# Patient Record
Sex: Male | Born: 1950 | Race: White | Hispanic: No | Marital: Married | Smoking: Never smoker
Health system: Southern US, Community
[De-identification: ages and names within clinical notes are randomized; demographics above are authoritative.]

## PROBLEM LIST (undated history)

## (undated) DIAGNOSIS — Z87442 Personal history of urinary calculi: Secondary | ICD-10-CM

## (undated) DIAGNOSIS — F329 Major depressive disorder, single episode, unspecified: Secondary | ICD-10-CM

## (undated) DIAGNOSIS — M5416 Radiculopathy, lumbar region: Secondary | ICD-10-CM

## (undated) DIAGNOSIS — K219 Gastro-esophageal reflux disease without esophagitis: Secondary | ICD-10-CM

## (undated) DIAGNOSIS — I10 Essential (primary) hypertension: Secondary | ICD-10-CM

## (undated) DIAGNOSIS — Z8489 Family history of other specified conditions: Secondary | ICD-10-CM

## (undated) DIAGNOSIS — M5412 Radiculopathy, cervical region: Secondary | ICD-10-CM

## (undated) DIAGNOSIS — F419 Anxiety disorder, unspecified: Secondary | ICD-10-CM

## (undated) DIAGNOSIS — C61 Malignant neoplasm of prostate: Secondary | ICD-10-CM

## (undated) DIAGNOSIS — F32A Depression, unspecified: Secondary | ICD-10-CM

## (undated) HISTORY — PX: TONSILLECTOMY: SUR1361

---

## 2000-02-11 ENCOUNTER — Encounter: Payer: Self-pay | Admitting: Internal Medicine

## 2000-02-11 ENCOUNTER — Encounter: Admission: RE | Admit: 2000-02-11 | Discharge: 2000-02-11 | Payer: Self-pay | Admitting: Internal Medicine

## 2000-04-29 ENCOUNTER — Encounter: Admission: RE | Admit: 2000-04-29 | Discharge: 2000-04-29 | Payer: Self-pay | Admitting: Internal Medicine

## 2000-04-29 ENCOUNTER — Encounter: Payer: Self-pay | Admitting: Internal Medicine

## 2000-08-01 ENCOUNTER — Encounter: Admission: RE | Admit: 2000-08-01 | Discharge: 2000-08-01 | Payer: Self-pay | Admitting: Urology

## 2000-08-01 ENCOUNTER — Encounter: Payer: Self-pay | Admitting: Urology

## 2001-11-23 ENCOUNTER — Encounter: Admission: RE | Admit: 2001-11-23 | Discharge: 2001-11-23 | Payer: Self-pay | Admitting: Urology

## 2001-11-23 ENCOUNTER — Encounter: Payer: Self-pay | Admitting: Urology

## 2002-04-20 ENCOUNTER — Encounter: Admission: RE | Admit: 2002-04-20 | Discharge: 2002-04-20 | Payer: Self-pay | Admitting: Urology

## 2002-04-20 ENCOUNTER — Encounter: Payer: Self-pay | Admitting: Urology

## 2003-11-07 ENCOUNTER — Ambulatory Visit (HOSPITAL_COMMUNITY): Admission: RE | Admit: 2003-11-07 | Discharge: 2003-11-07 | Payer: Self-pay | Admitting: Gastroenterology

## 2004-04-05 HISTORY — PX: OTHER SURGICAL HISTORY: SHX169

## 2004-06-07 ENCOUNTER — Emergency Department (HOSPITAL_COMMUNITY): Admission: EM | Admit: 2004-06-07 | Discharge: 2004-06-07 | Payer: Self-pay | Admitting: Emergency Medicine

## 2004-06-08 ENCOUNTER — Ambulatory Visit (HOSPITAL_BASED_OUTPATIENT_CLINIC_OR_DEPARTMENT_OTHER): Admission: RE | Admit: 2004-06-08 | Discharge: 2004-06-08 | Payer: Self-pay | Admitting: Urology

## 2004-06-20 ENCOUNTER — Ambulatory Visit (HOSPITAL_COMMUNITY): Admission: RE | Admit: 2004-06-20 | Discharge: 2004-06-20 | Payer: Self-pay | Admitting: Internal Medicine

## 2004-08-07 ENCOUNTER — Encounter: Admission: RE | Admit: 2004-08-07 | Discharge: 2004-08-07 | Payer: Self-pay | Admitting: Internal Medicine

## 2004-12-28 ENCOUNTER — Ambulatory Visit (HOSPITAL_COMMUNITY): Admission: RE | Admit: 2004-12-28 | Discharge: 2004-12-28 | Payer: Self-pay | Admitting: Urology

## 2006-04-05 HISTORY — PX: LITHOTRIPSY: SUR834

## 2010-04-26 ENCOUNTER — Encounter: Payer: Self-pay | Admitting: Internal Medicine

## 2012-08-15 ENCOUNTER — Other Ambulatory Visit: Payer: Self-pay | Admitting: Physician Assistant

## 2014-05-07 DIAGNOSIS — C61 Malignant neoplasm of prostate: Secondary | ICD-10-CM

## 2014-05-07 HISTORY — DX: Malignant neoplasm of prostate: C61

## 2014-05-07 HISTORY — PX: PROSTATE BIOPSY: SHX241

## 2014-06-03 ENCOUNTER — Encounter: Payer: Self-pay | Admitting: Radiation Oncology

## 2014-06-03 NOTE — Progress Notes (Signed)
GU Location of Tumor / Histology: Prostate   If Prostate Cancer, Gleason Score is (3 + 4) and PSA is (8.02)  Volume 49.3 cc     Past/Anticipated interventions by urology, if any: Dr. Raynelle Bring - Biopsy of the Prostate  Past/Anticipated interventions by medical oncology, if any: None  Weight changes, if any: no  Bowel/Bladder complaints, if any:  IPSS 0, nocturia x 1  Nausea/Vomiting, if any: no Pain issues, if any:  no  SAFETY ISSUES:  Prior radiation? NO  Pacemaker/ICD? No  Possible current pregnancy? N/A  Is the patient on methotrexate? 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

## 2014-06-04 ENCOUNTER — Ambulatory Visit
Admission: RE | Admit: 2014-06-04 | Discharge: 2014-06-04 | Disposition: A | Discharge status: Home / Self Care | Payer: BC Managed Care – PPO | Source: Ambulatory Visit | Attending: Radiation Oncology | Admitting: Radiation Oncology

## 2014-06-04 ENCOUNTER — Encounter: Payer: Self-pay | Admitting: Radiation Oncology

## 2014-06-04 VITALS — BP 145/78 | HR 79 | Temp 98.2°F | Resp 20 | Ht 67.5 in | Wt 168.0 lb

## 2014-06-04 DIAGNOSIS — C61 Malignant neoplasm of prostate: Secondary | ICD-10-CM | POA: Diagnosis not present

## 2014-06-04 DIAGNOSIS — I10 Essential (primary) hypertension: Secondary | ICD-10-CM | POA: Insufficient documentation

## 2014-06-04 DIAGNOSIS — Z7982 Long term (current) use of aspirin: Secondary | ICD-10-CM | POA: Insufficient documentation

## 2014-06-04 HISTORY — DX: Depression, unspecified: F32.A

## 2014-06-04 HISTORY — DX: Gastro-esophageal reflux disease without esophagitis: K21.9

## 2014-06-04 HISTORY — DX: Radiculopathy, lumbar region: M54.16

## 2014-06-04 HISTORY — DX: Malignant neoplasm of prostate: C61

## 2014-06-04 HISTORY — DX: Radiculopathy, cervical region: M54.12

## 2014-06-04 HISTORY — DX: Major depressive disorder, single episode, unspecified: F32.9

## 2014-06-04 HISTORY — DX: Essential (primary) hypertension: I10

## 2014-06-04 NOTE — Progress Notes (Signed)
Please see the Nurse Progress Note in the MD Initial Consult Encounter for this patient. 

## 2014-06-04 NOTE — Progress Notes (Signed)
[facility] NEW PATIENT EVALUATION  Name: Dan Medina MRN: [id number]  Date:   06/04/2014           DOB: 1950/04/29  Status: outpatient   CC: Dr. 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 male who is seen today through the courtesy of Dr. 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  He changed primary care physicians to Dr. Inda Merlin last year and began prostate cancer screening.  His PSA in December 2015 was elevated at 9.73.  He was seen by Dr. Alinda Money who repeated his PSA on 04/24/2014 and it was still elevated at 8.02 after a course of antibiotics.  He underwent ultrasound-guided biopsies on 05/07/2014.  He was found have Gleason 7 (3+4) involving 90% of one core from the right apex 2 with a pattern 4 equals 5%.  He also had Gleason 6 (3+3) involving 40% of one core from the right mid gland, 90% of a second core from the right apex, 5% of one core from the left lateral base, 50% of one core from the left lateral mid gland and 2 cores from the left apex.  Prostate volume was 49.3 mL.  He is doing well from a GU and GI standpoint.  His I PSS score is 1 and his SHIM score is 24.  PREVIOUS RADIATION THERAPY: No   PAST MEDICAL HISTORY:  has a past medical history of Cervical radiculopathy, chronic; Lumbar radiculopathy; Depression; Hypertension; Kidney stones; Esophageal reflux; and Prostate cancer (05/07/14).     PAST SURGICAL HISTORY:  Past Surgical History  Procedure Laterality Date  . Cystoscopy with ureteroscopy  2006  . Lithotripsy  2008  . Tonsillectomy    . Prostate biopsy  05/07/14    Gleason 7, volume 49.3 cc     FAMILY HISTORY: family history includes Cancer in his mother.  His mother died from lung  cancer at age 48, in his father died from old age at 32.  No family history of prostate cancer.   SOCIAL HISTORY:  reports that he has never smoked. He does not have any smokeless tobacco history on file. He reports that he drinks alcohol. He reports that he does not use illicit drugs.  Married, 2 children.  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 aspirin 81 MG tablet Take 81 mg by mouth daily.    Marland Kitchen atorvastatin (LIPITOR) 40 MG tablet Take 40 mg by mouth daily.    . Cholecalciferol (VITAMIN D-3) 1000 UNITS CAPS Take by mouth.    . Fish Oil OIL 1 tablet by Does not apply route daily.    . Multiple Vitamin (MULTI-DAY) TABS Take by mouth.    . valsartan-hydrochlorothiazide (DIOVAN-HCT) 160-12.5 MG per tablet Take 1 tablet by mouth daily.     No current facility-administered medications for this encounter.     REVIEW OF SYSTEMS:  Pertinent items are noted in HPI.    PHYSICAL EXAM:  height is 5' 7.5" (1.715 m) and weight is 168 lb (76.204 kg). His oral temperature is 98.2 F (36.8 C). His blood pressure is 145/78 and his pulse is 79. His respiration is  20.   Alert and oriented.  Rectal examination not performed today.   LABORATORY DATA:  No results found for: WBC, HGB, HCT, MCV, PLT No results found for: NA, K, CL, CO2 No results found for: ALT, AST, GGT, ALKPHOS, BILITOT   PSA 8.02 from 04/24/2014  IMPRESSION: Stage TIc intermediate risk adenocarcinoma prostate.  I explained to the patient that his prognosis is related to his stage, Gleason score, and PSA level.  His stage and PSA level are favorable while his Gleason score of 7 is of intermediate favorability.  Other risk factors include disease volume and PSA doubling time.  He does have high disease volume, and his PSA doubling time is unknown.  We discussed surgery versus active surveillance versus radiation  therapy.  I do not recommend active surveillance in view of his young age and Gleason 7.  Radiation therapy options include 5 weeks of external beam followed by seed implantation or 8 weeks of external beam/IMRT.  We also discussed the option of short-term androgen deprivation therapy which is being looked at in a national trial.  We discussed the potential acute and late toxicities of radiation therapy.  In view of his relatively young age, I favor surgery over radiation therapy.  He also understands that they may be a small chance that he would need postoperative radiation therapy adjuvantly, or for salvage in the future.   PLAN: As discussed above.   I spent 55 minutes minutes face to face with the patient and more than 50% of that time was spent in counseling and/or coordination of care.

## 2014-06-19 ENCOUNTER — Other Ambulatory Visit: Payer: Self-pay | Admitting: Urology

## 2014-07-31 NOTE — Patient Instructions (Signed)
Dan Medina  07/31/2014   Your procedure is scheduled on: 08/05/2014    Report to [facility] Main  Entrance and follow signs to               [facility] at     [place] AM.  Call this number if you have problems the morning of surgery [phone]   Remember:    Magnesium Citrate- 8-10 ounces by 12noon day before surgery.  Fleets enema nite before surgery.    Do not eat food or drink liquids :After Midnight.     Take these medicines the morning of surgery with A SIP OF WATER: none                                You may not have any metal on your body including hair pins and              piercings  Do not wear jewelry, , lotions, powders or perfumes., deodorant.               Do not wear nail polish.  Do not shave  48 hours prior to surgery.                 Do not bring valuables to the hospital. Spray.  Contacts, dentures or bridgework may not be worn into surgery.  Leave suitcase in the car. After surgery it may be brought to your room.       Special Instructions:coughing and deep breathing exercises, leg exercises               Please read over the following fact sheets you were given: _____________________________________________________________________             [facility] - Preparing for Surgery Before surgery, you can play an important role.  Because skin is not sterile, your skin needs to be as free of germs as possible.  You can reduce the number of germs on your skin by washing with CHG (chlorahexidine gluconate) soap before surgery.  CHG is an antiseptic cleaner which kills germs and bonds with the skin to continue killing germs even after washing. Please DO NOT use if you have an allergy to CHG or antibacterial soaps.  If your skin becomes reddened/irritated stop using the CHG and inform your nurse when you arrive at Short Stay. Do not shave (including legs and underarms) for at least  48 hours prior to the first CHG shower.  You may shave your face/neck. Please follow these instructions carefully:  1.  Shower with CHG Soap the night before surgery and the  morning of Surgery.  2.  If you choose to wash your hair, wash your hair first as usual with your  normal  shampoo.  3.  After you shampoo, rinse your hair and body thoroughly to remove the  shampoo.                           4.  Use CHG as you would any other liquid soap.  You can apply chg directly  to the skin and wash  Gently with a scrungie or clean washcloth.  5.  Apply the CHG Soap to your body ONLY FROM THE NECK DOWN.   Do not use on face/ open                           Wound or open sores. Avoid contact with eyes, ears mouth and genitals (private parts).                       Wash face,  Genitals (private parts) with your normal soap.             6.  Wash thoroughly, paying special attention to the area where your surgery  will be performed.  7.  Thoroughly rinse your body with warm water from the neck down.  8.  DO NOT shower/wash with your normal soap after using and rinsing off  the CHG Soap.                9.  Pat yourself dry with a clean towel.            10.  Wear clean pajamas.            11.  Place clean sheets on your bed the night of your first shower and do not  sleep with pets. Day of Surgery : Do not apply any lotions/deodorants the morning of surgery.  Please wear clean clothes to the hospital/surgery center.  FAILURE TO FOLLOW THESE INSTRUCTIONS MAY RESULT IN THE CANCELLATION OF YOUR SURGERY PATIENT SIGNATURE_________________________________  NURSE SIGNATURE__________________________________  ________________________________________________________________________  WHAT IS A BLOOD TRANSFUSION? Blood Transfusion Information  A transfusion is the replacement of blood or some of its parts. Blood is made up of multiple cells which provide different functions.  Red blood  cells carry oxygen and are used for blood loss replacement.  White blood cells fight against infection.  Platelets control bleeding.  Plasma helps clot blood.  Other blood products are available for specialized needs, such as hemophilia or other clotting disorders. BEFORE THE TRANSFUSION  Who gives blood for transfusions?   Healthy volunteers who are fully evaluated to make sure their blood is safe. This is blood bank blood. Transfusion therapy is the safest it has ever been in the practice of medicine. Before blood is taken from a donor, a complete history is taken to make sure that person has no history of diseases nor engages in risky social behavior (examples are intravenous drug use or sexual activity with multiple partners). The donor's travel history is screened to minimize risk of transmitting infections, such as malaria. The donated blood is tested for signs of infectious diseases, such as HIV and hepatitis. The blood is then tested to be sure it is compatible with you in order to minimize the chance of a transfusion reaction. If you or a relative donates blood, this is often done in anticipation of surgery and is not appropriate for emergency situations. It takes many days to process the donated blood. RISKS AND COMPLICATIONS Although transfusion therapy is very safe and saves many lives, the main dangers of transfusion include:  1. Getting an infectious disease. 2. Developing a transfusion reaction. This is an allergic reaction to something in the blood you were given. Every precaution is taken to prevent this. The decision to have a blood transfusion has been considered carefully by your caregiver before blood is given. Blood is not given unless the benefits outweigh  the risks. AFTER THE TRANSFUSION  Right after receiving a blood transfusion, you will usually feel much better and more energetic. This is especially true if your red blood cells have gotten low (anemic). The transfusion  raises the level of the red blood cells which carry oxygen, and this usually causes an energy increase.  The nurse administering the transfusion will monitor you carefully for complications. HOME CARE INSTRUCTIONS  No special instructions are needed after a transfusion. You may find your energy is better. Speak with your caregiver about any limitations on activity for underlying diseases you may have. SEEK MEDICAL CARE IF:   Your condition is not improving after your transfusion.  You develop redness or irritation at the intravenous (IV) site. SEEK IMMEDIATE MEDICAL CARE IF:  Any of the following symptoms occur over the next 12 hours:  Shaking chills.  You have a temperature by mouth above 102 F (38.9 C), not controlled by medicine.  Chest, back, or muscle pain.  People around you feel you are not acting correctly or are confused.  Shortness of breath or difficulty breathing.  Dizziness and fainting.  You get a rash or develop hives.  You have a decrease in urine output.  Your urine turns a dark color or changes to pink, red, or brown. Any of the following symptoms occur over the next 10 days:  You have a temperature by mouth above 102 F (38.9 C), not controlled by medicine.  Shortness of breath.  Weakness after normal activity.  The white part of the eye turns yellow (jaundice).  You have a decrease in the amount of urine or are urinating less often.  Your urine turns a dark color or changes to pink, red, or brown. Document Released: 03/19/2000 Document Revised: 06/14/2011 Document Reviewed: 11/06/2007 ExitCare Patient Information 2014 [place].  _______________________________________________________________________  Incentive Spirometer  An incentive spirometer is a tool that can help keep your lungs clear and active. This tool measures how well you are filling your lungs with each breath. Taking long deep breaths may help reverse or decrease the chance  of developing breathing (pulmonary) problems (especially infection) following:  A long period of time when you are unable to move or be active. BEFORE THE PROCEDURE   If the spirometer includes an indicator to show your best effort, your nurse or respiratory therapist will set it to a desired goal.  If possible, sit up straight or lean slightly forward. Try not to slouch.  Hold the incentive spirometer in an upright position. INSTRUCTIONS FOR USE  3. Sit on the edge of your bed if possible, or sit up as far as you can in bed or on a chair. 4. Hold the incentive spirometer in an upright position. 5. Breathe out normally. 6. Place the mouthpiece in your mouth and seal your lips tightly around it. 7. Breathe in slowly and as deeply as possible, raising the piston or the ball toward the top of the column. 8. Hold your breath for 3-5 seconds or for as long as possible. Allow the piston or ball to fall to the bottom of the column. 9. Remove the mouthpiece from your mouth and breathe out normally. 10. Rest for a few seconds and repeat Steps 1 through 7 at least 10 times every 1-2 hours when you are awake. Take your time and take a few normal breaths between deep breaths. 11. The spirometer may include an indicator to show your best effort. Use the indicator as a goal to work  toward during each repetition. 12. After each set of 10 deep breaths, practice coughing to be sure your lungs are clear. If you have an incision (the cut made at the time of surgery), support your incision when coughing by placing a pillow or rolled up towels firmly against it. Once you are able to get out of bed, walk around indoors and cough well. You may stop using the incentive spirometer when instructed by your caregiver.  RISKS AND COMPLICATIONS  Take your time so you do not get dizzy or light-headed.  If you are in pain, you may need to take or ask for pain medication before doing incentive spirometry. It is harder to  take a deep breath if you are having pain. AFTER USE  Rest and breathe slowly and easily.  It can be helpful to keep track of a log of your progress. Your caregiver can provide you with a simple table to help with this. If you are using the spirometer at home, follow these instructions: [place] IF:   You are having difficultly using the spirometer.  You have trouble using the spirometer as often as instructed.  Your pain medication is not giving enough relief while using the spirometer.  You develop fever of 100.5 F (38.1 C) or higher. SEEK IMMEDIATE MEDICAL CARE IF:   You cough up bloody sputum that had not been present before.  You develop fever of 102 F (38.9 C) or greater.  You develop worsening pain at or near the incision site. MAKE SURE YOU:   Understand these instructions.  Will watch your condition.  Will get help right away if you are not doing well or get worse. Document Released: 08/02/2006 Document Revised: 06/14/2011 Document Reviewed: 10/03/2006 [facility] Patient Information 2014 [place], [place].   ________________________________________________________________________

## 2014-08-01 ENCOUNTER — Encounter (HOSPITAL_COMMUNITY): Payer: Self-pay

## 2014-08-01 ENCOUNTER — Encounter (HOSPITAL_COMMUNITY)
Admission: RE | Admit: 2014-08-01 | Discharge: 2014-08-01 | Disposition: A | Discharge status: Home / Self Care | Payer: BC Managed Care – PPO | Source: Ambulatory Visit | Attending: Urology | Admitting: Urology

## 2014-08-01 ENCOUNTER — Ambulatory Visit (HOSPITAL_COMMUNITY)
Admission: RE | Admit: 2014-08-01 | Discharge: 2014-08-01 | Disposition: A | Discharge status: Home / Self Care | Payer: BC Managed Care – PPO | Source: Ambulatory Visit | Attending: Urology | Admitting: Urology

## 2014-08-01 DIAGNOSIS — C61 Malignant neoplasm of prostate: Secondary | ICD-10-CM | POA: Insufficient documentation

## 2014-08-01 DIAGNOSIS — Z01818 Encounter for other preprocedural examination: Secondary | ICD-10-CM | POA: Diagnosis not present

## 2014-08-01 HISTORY — DX: Family history of other specified conditions: Z84.89

## 2014-08-01 HISTORY — DX: Anxiety disorder, unspecified: F41.9

## 2014-08-01 LAB — CBC
HEMATOCRIT: 45.2 % (ref 39.0–52.0)
HEMOGLOBIN: 14.7 g/dL (ref 13.0–17.0)
MCH: 31.2 pg (ref 26.0–34.0)
MCHC: 32.5 g/dL (ref 30.0–36.0)
MCV: 96 fL (ref 78.0–100.0)
Platelets: 221 10*3/uL (ref 150–400)
RBC: 4.71 MIL/uL (ref 4.22–5.81)
RDW: 12.9 % (ref 11.5–15.5)
WBC: 7.1 10*3/uL (ref 4.0–10.5)

## 2014-08-01 LAB — BASIC METABOLIC PANEL
Anion gap: 8 (ref 5–15)
BUN: 15 mg/dL (ref 6–23)
CHLORIDE: 104 mmol/L (ref 96–112)
CO2: 27 mmol/L (ref 19–32)
Calcium: 9.9 mg/dL (ref 8.4–10.5)
Creatinine, Ser: 0.88 mg/dL (ref 0.50–1.35)
Glucose, Bld: 125 mg/dL — ABNORMAL HIGH (ref 70–99)
Potassium: 4.9 mmol/L (ref 3.5–5.1)
SODIUM: 139 mmol/L (ref 135–145)

## 2014-08-01 NOTE — Progress Notes (Signed)
EKG 03/27/2014 - on chart

## 2014-08-02 NOTE — H&P (Signed)
  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 He is very healthy overall. He has no family history of prostate cancer.  TNM stage: cT1c Nx Mx PSA: 8.02 Gleason score: 3+4=7 Biopsy (05/07/14): 7/12 cores positive  Left: L apex (2/2 cores, 20%. < 5%, 3+3=6), L lateral mid (50%, 3+3=6), L lateral base (5%, 3+3=6)  Right: R apex (2/2 cores, 90% with 3+4=7 and PNI, 90% with 3+3=6), R mid 40%, 3+3=6) Prostate volume: 49.3 cc  Nomogram OC disease: 30% EPE: 68% SVI: 6% LNI; 5% PFS (surgery): 81% at 5 years, 70% at 10 years  Urinary function: IPSS is 5. Erectile function: SHIM score is 24.    Past Medical History Problems  1. History of Cervical radiculopathy, chronic (M54.12) 2. History of depression (Z86.59) 3. History of esophageal reflux (Z87.19) 4. History of hypertension (Z86.79) 5. History of kidney stones (Z87.442) 6. History of Lumbar radiculopathy, chronic (M54.16)  Surgical History Problems  1. History of Cystoscopy With Ureteroscopy With Removal Of Calculus 2. History of Lithotripsy 3. History of Tonsillectomy  Current Meds 1. Adult Aspirin Low Strength 81 MG TBDP;  Therapy: (Recorded:25Jan2011) to Recorded 2. Diovan HCT 160-12.5 MG Oral Tablet;  Therapy: [id number] to Recorded 3. Fish Oil CAPS;  Therapy: (Recorded:25Jan2011) to Recorded 4. Lipitor 40 MG Oral Tablet;  Therapy: (Recorded:20Jan2016) to Recorded 5. Multi-Day TABS;  Therapy: (Recorded:25Jan2011) to Recorded 6. Omeprazole 20 MG Oral Capsule Delayed Release;  Therapy: (Recorded:20Jan2016) to Recorded 7. Vitamin D3 1000 UNIT Oral Tablet;  Therapy: (Recorded:20Jan2016) to Recorded 8. Zolpidem Tartrate 5 MG Oral Tablet;  Therapy: [id number] to  Recorded  Allergies Medication  1. Codeine Derivatives  Family History Problems  1. Family history of hypertension (Z82.49) : Brother 2. Family history of lung cancer (Z80.1) : Mother 3. Family history of transient ischemic attacks (Z82.3) : Father, Sister  Social History Problems  1. Alcohol use (Z78.9)   up to 2 beers daily 2. Married 3. Never a smoker    Physical Exam Constitutional: Well nourished and well developed . No acute distress.  ENT:. The ears and nose are normal in appearance.  Neck: The appearance of the neck is normal and no neck mass is present.  Pulmonary: No respiratory distress, normal respiratory rhythm and effort and clear bilateral breath sounds.  Cardiovascular: Heart rate and rhythm are normal . No peripheral edema.  Abdomen: The abdomen is soft and nontender. No masses are palpated. No CVA tenderness. No hernias are palpable. No hepatosplenomegaly noted.  Neuro/Psych:. Mood and affect are appropriate.      Assessment Assessed  1. Prostate cancer (C61)    Discussion/Summary 1. Prostate cancer:  He will undergo a bilateral nerve sparing robot-assisted laparoscopic radical prostatectomy and pelvic lymphadenectomy.

## 2014-08-05 ENCOUNTER — Inpatient Hospital Stay (HOSPITAL_COMMUNITY): Payer: BC Managed Care – PPO | Admitting: Certified Registered Nurse Anesthetist

## 2014-08-05 ENCOUNTER — Encounter (HOSPITAL_COMMUNITY): Payer: Self-pay | Admitting: *Deleted

## 2014-08-05 ENCOUNTER — Encounter (HOSPITAL_COMMUNITY)
Admission: RE | Disposition: A | Discharge status: Home / Self Care | Payer: Self-pay | Source: Ambulatory Visit | Attending: Urology

## 2014-08-05 ENCOUNTER — Inpatient Hospital Stay (HOSPITAL_COMMUNITY)
Admission: RE | Admit: 2014-08-05 | Discharge: 2014-08-06 | DRG: 708 | Disposition: A | Discharge status: Home / Self Care | Payer: BC Managed Care – PPO | Source: Ambulatory Visit | Attending: Urology | Admitting: Urology

## 2014-08-05 DIAGNOSIS — Z87442 Personal history of urinary calculi: Secondary | ICD-10-CM

## 2014-08-05 DIAGNOSIS — C61 Malignant neoplasm of prostate: Secondary | ICD-10-CM | POA: Diagnosis present

## 2014-08-05 DIAGNOSIS — Z79899 Other long term (current) drug therapy: Secondary | ICD-10-CM

## 2014-08-05 DIAGNOSIS — Z801 Family history of malignant neoplasm of trachea, bronchus and lung: Secondary | ICD-10-CM

## 2014-08-05 DIAGNOSIS — Z8249 Family history of ischemic heart disease and other diseases of the circulatory system: Secondary | ICD-10-CM

## 2014-08-05 DIAGNOSIS — Z7982 Long term (current) use of aspirin: Secondary | ICD-10-CM

## 2014-08-05 DIAGNOSIS — K219 Gastro-esophageal reflux disease without esophagitis: Secondary | ICD-10-CM | POA: Diagnosis present

## 2014-08-05 DIAGNOSIS — I10 Essential (primary) hypertension: Secondary | ICD-10-CM | POA: Diagnosis present

## 2014-08-05 HISTORY — PX: LYMPHADENECTOMY: SHX5960

## 2014-08-05 HISTORY — PX: ROBOT ASSISTED LAPAROSCOPIC RADICAL PROSTATECTOMY: SHX5141

## 2014-08-05 LAB — ABO/RH: ABO/RH(D): O POS

## 2014-08-05 LAB — TYPE AND SCREEN
ABO/RH(D): O POS
Antibody Screen: NEGATIVE

## 2014-08-05 LAB — HEMOGLOBIN AND HEMATOCRIT, BLOOD
HCT: 40.3 % (ref 39.0–52.0)
Hemoglobin: 13.2 g/dL (ref 13.0–17.0)

## 2014-08-05 SURGERY — ROBOTIC ASSISTED LAPAROSCOPIC RADICAL PROSTATECTOMY LEVEL 2
Anesthesia: General

## 2014-08-05 MED ORDER — STERILE WATER FOR IRRIGATION IR SOLN
Status: DC | PRN
  Administered 2014-08-05: 3000 mL

## 2014-08-05 MED ORDER — MORPHINE SULFATE 2 MG/ML IJ SOLN: 2.0000 mg | INTRAMUSCULAR | Status: DC | PRN

## 2014-08-05 MED ORDER — CIPROFLOXACIN HCL 500 MG PO TABS: 500.0000 mg | ORAL_TABLET | Freq: Two times a day (BID) | ORAL | Status: DC

## 2014-08-05 MED ORDER — CEFAZOLIN SODIUM-DEXTROSE 2-3 GM-% IV SOLR
2.0000 g | INTRAVENOUS | Status: AC
  Administered 2014-08-05: 2 g via INTRAVENOUS

## 2014-08-05 MED ORDER — BUPIVACAINE-EPINEPHRINE 0.25% -1:200000 IJ SOLN
INTRAMUSCULAR | Status: DC | PRN
  Administered 2014-08-05: 30 mL

## 2014-08-05 MED ORDER — CISATRACURIUM BESYLATE (PF) 10 MG/5ML IV SOLN
INTRAVENOUS | Status: DC | PRN
  Administered 2014-08-05: 8 mg via INTRAVENOUS
  Administered 2014-08-05: 2 mg via INTRAVENOUS

## 2014-08-05 MED ORDER — HYDROCODONE-ACETAMINOPHEN 5-325 MG PO TABS: 1.0000 | ORAL_TABLET | Freq: Four times a day (QID) | ORAL | Status: DC | PRN

## 2014-08-05 MED ORDER — SODIUM CHLORIDE 0.9 % IJ SOLN
INTRAMUSCULAR | Status: AC
  Filled 2014-08-05: qty 10

## 2014-08-05 MED ORDER — ATORVASTATIN CALCIUM 40 MG PO TABS
40.0000 mg | ORAL_TABLET | Freq: Every day | ORAL | Status: DC
  Administered 2014-08-05 – 2014-08-06 (×2): 40 mg via ORAL
  Filled 2014-08-05 (×3): qty 1

## 2014-08-05 MED ORDER — HYDROMORPHONE HCL 1 MG/ML IJ SOLN
0.2500 mg | INTRAMUSCULAR | Status: DC | PRN
  Administered 2014-08-05 (×4): 0.5 mg via INTRAVENOUS

## 2014-08-05 MED ORDER — ACETAMINOPHEN 325 MG PO TABS
650.0000 mg | ORAL_TABLET | ORAL | Status: DC | PRN
  Administered 2014-08-06: 650 mg via ORAL
  Filled 2014-08-05 (×2): qty 2

## 2014-08-05 MED ORDER — LACTATED RINGERS IV SOLN
INTRAVENOUS | Status: DC | PRN
  Administered 2014-08-05 (×2): via INTRAVENOUS

## 2014-08-05 MED ORDER — ACETAMINOPHEN 10 MG/ML IV SOLN
1000.0000 mg | Freq: Once | INTRAVENOUS | Status: AC
  Administered 2014-08-05: 1000 mg via INTRAVENOUS
  Filled 2014-08-05: qty 100

## 2014-08-05 MED ORDER — DEXAMETHASONE SODIUM PHOSPHATE 10 MG/ML IJ SOLN
INTRAMUSCULAR | Status: DC | PRN
  Administered 2014-08-05: 10 mg via INTRAVENOUS

## 2014-08-05 MED ORDER — GLYCOPYRROLATE 0.2 MG/ML IJ SOLN
INTRAMUSCULAR | Status: AC
  Filled 2014-08-05: qty 2

## 2014-08-05 MED ORDER — FENTANYL CITRATE (PF) 100 MCG/2ML IJ SOLN
INTRAMUSCULAR | Status: DC | PRN
  Administered 2014-08-05: 50 ug via INTRAVENOUS
  Administered 2014-08-05: 100 ug via INTRAVENOUS
  Administered 2014-08-05 (×2): 50 ug via INTRAVENOUS
  Administered 2014-08-05: 100 ug via INTRAVENOUS
  Administered 2014-08-05 (×3): 50 ug via INTRAVENOUS

## 2014-08-05 MED ORDER — GLYCOPYRROLATE 0.2 MG/ML IJ SOLN
INTRAMUSCULAR | Status: DC | PRN
  Administered 2014-08-05: .3 mg via INTRAVENOUS

## 2014-08-05 MED ORDER — NEOSTIGMINE METHYLSULFATE 10 MG/10ML IV SOLN
INTRAVENOUS | Status: DC | PRN
  Administered 2014-08-05: 2 mg via INTRAVENOUS

## 2014-08-05 MED ORDER — KETOROLAC TROMETHAMINE 15 MG/ML IJ SOLN
15.0000 mg | Freq: Four times a day (QID) | INTRAMUSCULAR | Status: DC
  Administered 2014-08-05 – 2014-08-06 (×4): 15 mg via INTRAVENOUS
  Filled 2014-08-05 (×8): qty 1

## 2014-08-05 MED ORDER — MIDAZOLAM HCL 5 MG/5ML IJ SOLN
INTRAMUSCULAR | Status: DC | PRN
  Administered 2014-08-05 (×2): 1 mg via INTRAVENOUS

## 2014-08-05 MED ORDER — PROPOFOL 10 MG/ML IV BOLUS
INTRAVENOUS | Status: AC
  Filled 2014-08-05: qty 20

## 2014-08-05 MED ORDER — HEPARIN SODIUM (PORCINE) 1000 UNIT/ML IJ SOLN
INTRAMUSCULAR | Status: DC | PRN
  Administered 2014-08-05: 08:00:00

## 2014-08-05 MED ORDER — SODIUM CHLORIDE 0.9 % IR SOLN
Status: DC | PRN
  Administered 2014-08-05: 1000 mL

## 2014-08-05 MED ORDER — HYDROMORPHONE HCL 1 MG/ML IJ SOLN
INTRAMUSCULAR | Status: AC
  Filled 2014-08-05: qty 1

## 2014-08-05 MED ORDER — DEXAMETHASONE SODIUM PHOSPHATE 10 MG/ML IJ SOLN
INTRAMUSCULAR | Status: AC
  Filled 2014-08-05: qty 1

## 2014-08-05 MED ORDER — LIDOCAINE HCL (CARDIAC) 20 MG/ML IV SOLN
INTRAVENOUS | Status: AC
  Filled 2014-08-05: qty 5

## 2014-08-05 MED ORDER — ONDANSETRON HCL 4 MG/2ML IJ SOLN: 4.0000 mg | Freq: Once | INTRAMUSCULAR | Status: DC | PRN

## 2014-08-05 MED ORDER — DIPHENHYDRAMINE HCL 50 MG/ML IJ SOLN: 12.5000 mg | Freq: Four times a day (QID) | INTRAMUSCULAR | Status: DC | PRN

## 2014-08-05 MED ORDER — FLEET ENEMA 7-19 GM/118ML RE ENEM: 1.0000 | ENEMA | Freq: Once | RECTAL | Status: DC

## 2014-08-05 MED ORDER — SUCCINYLCHOLINE CHLORIDE 20 MG/ML IJ SOLN
INTRAMUSCULAR | Status: DC | PRN
  Administered 2014-08-05: 120 mg via INTRAVENOUS

## 2014-08-05 MED ORDER — HEPARIN SODIUM (PORCINE) 1000 UNIT/ML IJ SOLN
INTRAMUSCULAR | Status: AC
  Filled 2014-08-05: qty 1

## 2014-08-05 MED ORDER — NEOSTIGMINE METHYLSULFATE 10 MG/10ML IV SOLN
INTRAVENOUS | Status: AC
  Filled 2014-08-05: qty 1

## 2014-08-05 MED ORDER — PROPOFOL 10 MG/ML IV BOLUS
INTRAVENOUS | Status: DC | PRN
  Administered 2014-08-05: 200 mg via INTRAVENOUS

## 2014-08-05 MED ORDER — DIPHENHYDRAMINE HCL 12.5 MG/5ML PO ELIX: 12.5000 mg | ORAL_SOLUTION | Freq: Four times a day (QID) | ORAL | Status: DC | PRN

## 2014-08-05 MED ORDER — FENTANYL CITRATE (PF) 250 MCG/5ML IJ SOLN
INTRAMUSCULAR | Status: AC
  Filled 2014-08-05: qty 5

## 2014-08-05 MED ORDER — LIDOCAINE HCL (CARDIAC) 20 MG/ML IV SOLN
INTRAVENOUS | Status: DC | PRN
  Administered 2014-08-05: 75 mg via INTRAVENOUS

## 2014-08-05 MED ORDER — CEFAZOLIN SODIUM-DEXTROSE 2-3 GM-% IV SOLR
INTRAVENOUS | Status: AC
  Filled 2014-08-05: qty 50

## 2014-08-05 MED ORDER — FENTANYL CITRATE (PF) 100 MCG/2ML IJ SOLN
25.0000 ug | INTRAMUSCULAR | Status: DC | PRN
  Administered 2014-08-05: 50 ug via INTRAVENOUS

## 2014-08-05 MED ORDER — MIDAZOLAM HCL 2 MG/2ML IJ SOLN
INTRAMUSCULAR | Status: AC
  Filled 2014-08-05: qty 2

## 2014-08-05 MED ORDER — SODIUM CHLORIDE 0.9 % IV BOLUS (SEPSIS)
1000.0000 mL | Freq: Once | INTRAVENOUS | Status: AC
  Administered 2014-08-05: 1000 mL via INTRAVENOUS

## 2014-08-05 MED ORDER — FENTANYL CITRATE (PF) 100 MCG/2ML IJ SOLN
INTRAMUSCULAR | Status: AC
  Filled 2014-08-05: qty 2

## 2014-08-05 MED ORDER — ONDANSETRON HCL 4 MG/2ML IJ SOLN
INTRAMUSCULAR | Status: AC
Start: 2014-08-05 — End: 2014-08-05
  Filled 2014-08-05: qty 2

## 2014-08-05 MED ORDER — ZOLPIDEM TARTRATE 5 MG PO TABS
5.0000 mg | ORAL_TABLET | Freq: Once | ORAL | Status: AC
Start: 2014-08-05 — End: 2014-08-05
  Administered 2014-08-05: 5 mg via ORAL
  Filled 2014-08-05: qty 1

## 2014-08-05 MED ORDER — KCL IN DEXTROSE-NACL 20-5-0.45 MEQ/L-%-% IV SOLN
INTRAVENOUS | Status: AC
  Administered 2014-08-05: 1000 mL
  Filled 2014-08-05: qty 1000

## 2014-08-05 MED ORDER — CEFAZOLIN SODIUM 1-5 GM-% IV SOLN
1.0000 g | Freq: Three times a day (TID) | INTRAVENOUS | Status: AC
  Administered 2014-08-05 (×2): 1 g via INTRAVENOUS
  Filled 2014-08-05 (×2): qty 50

## 2014-08-05 MED ORDER — DOCUSATE SODIUM 100 MG PO CAPS
100.0000 mg | ORAL_CAPSULE | Freq: Two times a day (BID) | ORAL | Status: DC
  Administered 2014-08-05 – 2014-08-06 (×3): 100 mg via ORAL
  Filled 2014-08-05 (×4): qty 1

## 2014-08-05 MED ORDER — MAGNESIUM CITRATE PO SOLN
1.0000 | Freq: Once | ORAL | Status: DC
  Filled 2014-08-05: qty 296

## 2014-08-05 MED ORDER — KCL IN DEXTROSE-NACL 20-5-0.45 MEQ/L-%-% IV SOLN
INTRAVENOUS | Status: DC
  Administered 2014-08-05: 23:00:00 via INTRAVENOUS
  Administered 2014-08-05: 1000 mL via INTRAVENOUS
  Administered 2014-08-06: 06:00:00 via INTRAVENOUS
  Filled 2014-08-05 (×4): qty 1000

## 2014-08-05 MED ORDER — EPHEDRINE SULFATE 50 MG/ML IJ SOLN
INTRAMUSCULAR | Status: AC
  Filled 2014-08-05: qty 1

## 2014-08-05 MED ORDER — CISATRACURIUM BESYLATE 20 MG/10ML IV SOLN
INTRAVENOUS | Status: AC
  Filled 2014-08-05: qty 10

## 2014-08-05 MED ORDER — ONDANSETRON HCL 4 MG/2ML IJ SOLN
INTRAMUSCULAR | Status: DC | PRN
  Administered 2014-08-05 (×2): 2 mg via INTRAVENOUS

## 2014-08-05 MED ORDER — BUPIVACAINE-EPINEPHRINE (PF) 0.25% -1:200000 IJ SOLN
INTRAMUSCULAR | Status: AC
  Filled 2014-08-05: qty 30

## 2014-08-05 SURGICAL SUPPLY — 50 items
CABLE HIGH FREQUENCY MONO STRZ (ELECTRODE) ×4 IMPLANT
CATH FOLEY 2WAY SLVR 18FR 30CC (CATHETERS) ×4 IMPLANT
CATH ROBINSON RED A/P 16FR (CATHETERS) ×4 IMPLANT
CATH ROBINSON RED A/P 8FR (CATHETERS) ×4 IMPLANT
CATH TIEMANN FOLEY 18FR 5CC (CATHETERS) ×4 IMPLANT
CHLORAPREP W/TINT 26ML (MISCELLANEOUS) ×4 IMPLANT
CLIP LIGATING HEM O LOK PURPLE (MISCELLANEOUS) ×10 IMPLANT
CLOTH BEACON ORANGE TIMEOUT ST (SAFETY) ×4 IMPLANT
COVER SURGICAL LIGHT HANDLE (MISCELLANEOUS) ×4 IMPLANT
COVER TIP SHEARS 8 DVNC (MISCELLANEOUS) ×2 IMPLANT
COVER TIP SHEARS 8MM DA VINCI (MISCELLANEOUS) ×2
CUTTER ECHEON FLEX ENDO 45 340 (ENDOMECHANICALS) ×4 IMPLANT
DECANTER SPIKE VIAL GLASS SM (MISCELLANEOUS) ×2 IMPLANT
DRAPE SURG IRRIG POUCH 19X23 (DRAPES) ×4 IMPLANT
DRSG TEGADERM 4X4.75 (GAUZE/BANDAGES/DRESSINGS) ×4 IMPLANT
DRSG TEGADERM 6X8 (GAUZE/BANDAGES/DRESSINGS) ×8 IMPLANT
ELECT REM PT RETURN 9FT ADLT (ELECTROSURGICAL) ×4
ELECTRODE REM PT RTRN 9FT ADLT (ELECTROSURGICAL) ×2 IMPLANT
GLOVE BIO SURGEON STRL SZ 6.5 (GLOVE) ×3 IMPLANT
GLOVE BIO SURGEONS STRL SZ 6.5 (GLOVE) ×1
GLOVE BIOGEL M STRL SZ7.5 (GLOVE) ×8 IMPLANT
GOWN STRL REUS W/TWL LRG LVL3 (GOWN DISPOSABLE) ×12 IMPLANT
HOLDER FOLEY CATH W/STRAP (MISCELLANEOUS) ×4 IMPLANT
IV LACTATED RINGERS 1000ML (IV SOLUTION) ×2 IMPLANT
KIT ACCESSORY DA VINCI DISP (KITS) ×2
KIT ACCESSORY DVNC DISP (KITS) ×2 IMPLANT
LIQUID BAND (GAUZE/BANDAGES/DRESSINGS) IMPLANT
MANIFOLD NEPTUNE II (INSTRUMENTS) ×4 IMPLANT
NDL SAFETY ECLIPSE 18X1.5 (NEEDLE) ×2 IMPLANT
NEEDLE HYPO 18GX1.5 SHARP (NEEDLE) ×4
PACK ROBOT UROLOGY CUSTOM (CUSTOM PROCEDURE TRAY) ×4 IMPLANT
RELOAD GREEN ECHELON 45 (STAPLE) ×4 IMPLANT
SET TUBE IRRIG SUCTION NO TIP (IRRIGATION / IRRIGATOR) ×4 IMPLANT
SHEET LAVH (DRAPES) IMPLANT
SOLUTION ELECTROLUBE (MISCELLANEOUS) ×4 IMPLANT
SUT ETHILON 3 0 PS 1 (SUTURE) ×4 IMPLANT
SUT MNCRL 3 0 RB1 (SUTURE) ×2 IMPLANT
SUT MNCRL 3 0 VIOLET RB1 (SUTURE) ×2 IMPLANT
SUT MNCRL AB 4-0 PS2 18 (SUTURE) ×8 IMPLANT
SUT MONOCRYL 3 0 RB1 (SUTURE) ×6
SUT VIC AB 0 CT1 27 (SUTURE) ×4
SUT VIC AB 0 CT1 27XBRD ANTBC (SUTURE) ×2 IMPLANT
SUT VIC AB 0 UR5 27 (SUTURE) ×4 IMPLANT
SUT VIC AB 2-0 SH 27 (SUTURE) ×4
SUT VIC AB 2-0 SH 27X BRD (SUTURE) ×2 IMPLANT
SUT VICRYL 0 UR6 27IN ABS (SUTURE) ×8 IMPLANT
SYR 27GX1/2 1ML LL SAFETY (SYRINGE) ×4 IMPLANT
TOWEL OR 17X26 10 PK STRL BLUE (TOWEL DISPOSABLE) ×4 IMPLANT
TOWEL OR NON WOVEN STRL DISP B (DISPOSABLE) ×4 IMPLANT
WATER STERILE IRR 1500ML POUR (IV SOLUTION) ×4 IMPLANT

## 2014-08-05 NOTE — Discharge Instructions (Signed)

## 2014-08-05 NOTE — Op Note (Signed)

## 2014-08-05 NOTE — Progress Notes (Signed)
Patient ambulated 429ft in the hall tolerated it well, no distress, sitting-up in the chair after ambulation,

## 2014-08-05 NOTE — Transfer of Care (Signed)
Immediate Anesthesia Transfer of Care Note  Patient: Dan Medina  Procedure(s) Performed: Procedure(s): ROBOTIC ASSISTED LAPAROSCOPIC RADICAL PROSTATECTOMY LEVEL 2 (N/A) LYMPHADENECTOMY (Bilateral)  Patient Location: PACU  Anesthesia Type:General  Level of Consciousness: awake, alert , oriented, patient cooperative and responds to stimulation  Airway & Oxygen Therapy: Patient Spontanous Breathing and Patient connected to face mask oxygen  Post-op Assessment: Report given to RN, Post -op Vital signs reviewed and stable and Patient moving all extremities  Post vital signs: Reviewed and stable  Last Vitals:  Filed Vitals:   08/05/14 0525  BP: 140/90  Pulse: 86  Temp: 36.6 C  Resp: 18    Complications: No apparent anesthesia complications

## 2014-08-05 NOTE — Care Management Note (Signed)
Case Management Note  Patient Details  Name: Dan Medina MRN: [id number] Date of Birth: March 12, 1951  Subjective/Objective:    64 y/o m admitted w/Prostate Ca.              Action/Plan:From home.   Expected Discharge Date:  08/06/14               Expected Discharge Plan:  Home/Self Care  In-House Referral:     Discharge planning Services  CM Consult  Post Acute Care Choice:    Choice offered to:     DME Arranged:    DME Agency:     HH Arranged:    HH Agency:     Status of Service:  In process, will continue to follow  Medicare Important Message Given:    Date Medicare IM Given:    Medicare IM give by:    Date Additional Medicare IM Given:    Additional Medicare Important Message give by:     If discussed at [place] of Stay Meetings, dates discussed:    Additional Comments:  Dessa Phi, RN 08/05/2014, 3:04 PM

## 2014-08-05 NOTE — Progress Notes (Signed)
Patient ID: Dan Medina, male   DOB: [birth year], 64 y.o.   MRN: [id number] Post-op note  Subjective: The patient is doing well.  No complaints.  Objective: Vital signs in last 24 hours: Temp:  [97.4 F (36.3 C)-98.6 F (37 C)] 98.3 F (36.8 C) (05/02 1115) Pulse Rate:  [77-86] 84 (05/02 1115) Resp:  [12-18] 14 (05/02 1115) BP: (131-152)/(68-90) 152/82 mmHg (05/02 1115) SpO2:  [98 %-100 %] 99 % (05/02 1115) Weight:  [73.483 kg (162 lb)] 73.483 kg (162 lb) (05/02 0534)  Intake/Output from previous day:   Intake/Output this shift: Total I/O In: 1300 [I.V.:1300] Out: 490 [Urine:400; Drains:40; Blood:50]  Physical Exam:  General: Alert and oriented. Abdomen: Soft, Nondistended. Incisions: Clean and dry.   Lab Results:  Recent Labs  08/05/14 1025  HGB 13.2  HCT 40.3    Assessment/Plan: POD#0   1) Continue to monitor  2) DVT prophy, clears, IS, amb, pain control   LOS: 0 days   Dan Medina 08/05/2014, 3:09 PM

## 2014-08-05 NOTE — Anesthesia Postprocedure Evaluation (Signed)
  Anesthesia Post-op Note  Patient: Dan Medina  Procedure(s) Performed: Procedure(s): ROBOTIC ASSISTED LAPAROSCOPIC RADICAL PROSTATECTOMY LEVEL 2 (N/A) LYMPHADENECTOMY (Bilateral)  Patient Location: PACU  Anesthesia Type:General  Level of Consciousness: awake, alert , oriented and patient cooperative  Airway and Oxygen Therapy: Patient Spontanous Breathing  Post-op Pain: mild  Post-op Assessment: Post-op Vital signs reviewed, Patient's Cardiovascular Status Stable, Respiratory Function Stable, Patent Airway, No signs of Nausea or vomiting and Pain level controlled  Post-op Vital Signs: stable  Last Vitals:  Filed Vitals:   08/05/14 1115  BP: 152/82  Pulse: 84  Temp: 36.8 C  Resp: 14    Complications: No apparent anesthesia complications

## 2014-08-05 NOTE — Anesthesia Procedure Notes (Signed)
Procedure Name: Intubation Date/Time: 08/05/2014 7:18 AM Performed by: Ofilia Neas Pre-anesthesia Checklist: Patient identified, Emergency Drugs available, Suction available, Timeout performed and Patient being monitored Patient Re-evaluated:Patient Re-evaluated prior to inductionOxygen Delivery Method: Circle system utilized Preoxygenation: Pre-oxygenation with 100% oxygen Intubation Type: IV induction and Cricoid Pressure applied Ventilation: Mask ventilation without difficulty Laryngoscope Size: Mac and 4 Grade View: Grade I Tube type: Oral Tube size: 7.5 mm Number of attempts: 1 Airway Equipment and Method: Stylet Placement Confirmation: ETT inserted through vocal cords under direct vision,  positive ETCO2 and breath sounds checked- equal and bilateral Secured at: 20 cm Tube secured with: Tape Dental Injury: Teeth and Oropharynx as per pre-operative assessment

## 2014-08-05 NOTE — Anesthesia Preprocedure Evaluation (Signed)
Anesthesia Evaluation  Patient identified by MRN, date of birth, ID band Patient awake    Reviewed: Allergy & Precautions, NPO status , Patient's Chart, lab work & pertinent test results  Airway Mallampati: II       Dental   Pulmonary    Pulmonary exam normal       Cardiovascular hypertension, Rhythm:Regular Rate:Normal     Neuro/Psych  Neuromuscular disease    GI/Hepatic GERD-  ,  Endo/Other    Renal/GU Renal disease     Musculoskeletal   Abdominal   Peds  Hematology   Anesthesia Other Findings   Reproductive/Obstetrics                             Anesthesia Physical Anesthesia Plan  ASA: II  Anesthesia Plan: General   Post-op Pain Management:    Induction: Intravenous  Airway Management Planned: Oral ETT  Additional Equipment:   Intra-op Plan:   Post-operative Plan: Extubation in OR  Informed Consent: I have reviewed the patients History and Physical, chart, labs and discussed the procedure including the risks, benefits and alternatives for the proposed anesthesia with the patient or authorized representative who has indicated his/her understanding and acceptance.     Plan Discussed with: CRNA, Anesthesiologist and Surgeon  Anesthesia Plan Comments:         Anesthesia Quick Evaluation

## 2014-08-06 ENCOUNTER — Encounter (HOSPITAL_COMMUNITY): Payer: Self-pay | Admitting: Urology

## 2014-08-06 LAB — HEMOGLOBIN AND HEMATOCRIT, BLOOD
HEMATOCRIT: 39.2 % (ref 39.0–52.0)
Hemoglobin: 12.5 g/dL — ABNORMAL LOW (ref 13.0–17.0)

## 2014-08-06 MED ORDER — HYDROCODONE-ACETAMINOPHEN 5-325 MG PO TABS: 1.0000 | ORAL_TABLET | Freq: Four times a day (QID) | ORAL | Status: DC | PRN

## 2014-08-06 MED ORDER — BISACODYL 10 MG RE SUPP
10.0000 mg | Freq: Once | RECTAL | Status: AC
  Administered 2014-08-06: 10 mg via RECTAL
  Filled 2014-08-06: qty 1

## 2014-08-06 NOTE — Discharge Summary (Signed)
  Date of admission: 08/05/2014  Date of discharge: 08/06/2014  Admission diagnosis: Prostate Cancer  Discharge diagnosis: Prostate Cancer  History and Physical: For full details, please see admission history and physical. Briefly, Dan Medina is a 64 y.o. gentleman with localized prostate cancer.  After discussing management/treatment options, he elected to proceed with surgical treatment.  Hospital Course: Dan Medina was taken to the operating room on 08/05/2014 and underwent a robotic assisted laparoscopic radical prostatectomy. He tolerated this procedure well and without complications. Postoperatively, he was able to be transferred to a regular hospital room following recovery from anesthesia.  He was able to begin ambulating the night of surgery. He remained hemodynamically stable overnight.  He had excellent urine output with appropriately minimal output from his pelvic drain and his pelvic drain was removed on POD #1.  He was transitioned to oral pain medication, tolerated a clear liquid diet, and had met all discharge criteria and was able to be discharged home later on POD#1.  Laboratory values:  Recent Labs  08/05/14 1025 08/06/14 0607  HGB 13.2 12.5*  HCT 40.3 39.2    Disposition: Home  Discharge instruction: He was instructed to be ambulatory but to refrain from heavy lifting, strenuous activity, or driving. He was instructed on urethral catheter care.  Discharge medications:     Medication List    STOP taking these medications        aspirin 81 MG tablet     Vitamin D-3 1000 UNITS Caps      TAKE these medications        atorvastatin 40 MG tablet  Commonly known as:  LIPITOR  Take 40 mg by mouth daily.     ciprofloxacin 500 MG tablet  Commonly known as:  CIPRO  Take 1 tablet (500 mg total) by mouth 2 (two) times daily. Start day prior to office visit for foley removal     HYDROcodone-acetaminophen 5-325 MG per tablet  Commonly known as:  NORCO  Take  1-2 tablets by mouth every 6 (six) hours as needed.     valsartan-hydrochlorothiazide 160-12.5 MG per tablet  Commonly known as:  DIOVAN-HCT  Take 1 tablet by mouth every morning.        Followup: He will followup in 1 week for catheter removal and to discuss his surgical pathology results.

## 2014-08-06 NOTE — Progress Notes (Signed)
Patient discharged home with wife, foley care/management at [facility] demonstrated and explained to patient/wife discharge instructions given and explained to patient/wife and they verbalized understanding, patient denies any pain/distress. Surgical incision clean/dry/intact. No other wound noted, skin intact. Accompanied home by wife.

## 2014-08-06 NOTE — Progress Notes (Signed)
Patient ID: Dan Medina, male   DOB: 11-26-1950, 64 y.o.   MRN: [id number]  1 Day Post-Op Subjective: The patient is doing well.  No nausea or vomiting. Pain is adequately controlled.  Objective: Vital signs in last 24 hours: Temp:  [97.4 F (36.3 C)-98.6 F (37 C)] 98.1 F (36.7 C) (05/03 0552) Pulse Rate:  [74-86] 74 (05/03 0552) Resp:  [12-18] 18 (05/03 0552) BP: (103-152)/(60-82) 110/70 mmHg (05/03 0552) SpO2:  [95 %-100 %] 95 % (05/03 0552) FiO2 (%):  [2 %] 2 % (05/02 1641) Weight:  [74.39 kg (164 lb)] 74.39 kg (164 lb) (05/02 1610)  Intake/Output from previous day: 05/02 0701 - 05/03 0700 In: 4130 [I.V.:4080; IV Piggyback:50] Out: 2951 [Urine:3900; Drains:210; Blood:50] Intake/Output this shift:    Physical Exam:  General: Alert and oriented. CV: RRR Lungs: Clear bilaterally. GI: Soft, Nondistended. Incisions: Clean, dry, and intact Urine: Clear Extremities: Nontender, no erythema, no edema.  Lab Results:  Recent Labs  08/05/14 1025 08/06/14 0607  HGB 13.2 12.5*  HCT 40.3 39.2      Assessment/Plan: POD# 1 s/p robotic prostatectomy.  1) SL IVF 2) Ambulate, Incentive spirometry 3) Transition to oral pain medication 4) Dulcolax suppository 5) D/C pelvic drain 6) Plan for likely discharge later today   Pryor Curia. MD   LOS: 1 day   Mikyle Sox,LES 08/06/2014, 7:56 AM

## 2016-06-11 ENCOUNTER — Other Ambulatory Visit: Payer: Self-pay | Admitting: Gastroenterology

## 2016-09-20 ENCOUNTER — Ambulatory Visit (HOSPITAL_COMMUNITY)
Admission: RE | Admit: 2016-09-20 | Discharge: 2016-09-20 | Disposition: A | Discharge status: Home / Self Care | Payer: BC Managed Care – PPO | Source: Ambulatory Visit | Attending: Gastroenterology | Admitting: Gastroenterology

## 2016-09-20 ENCOUNTER — Encounter (HOSPITAL_COMMUNITY)
Admission: RE | Disposition: A | Discharge status: Home / Self Care | Payer: Self-pay | Source: Ambulatory Visit | Attending: Gastroenterology

## 2016-09-20 ENCOUNTER — Ambulatory Visit (HOSPITAL_COMMUNITY): Payer: BC Managed Care – PPO | Admitting: Registered Nurse

## 2016-09-20 ENCOUNTER — Encounter (HOSPITAL_COMMUNITY): Payer: Self-pay

## 2016-09-20 DIAGNOSIS — Z8546 Personal history of malignant neoplasm of prostate: Secondary | ICD-10-CM | POA: Insufficient documentation

## 2016-09-20 DIAGNOSIS — F329 Major depressive disorder, single episode, unspecified: Secondary | ICD-10-CM | POA: Diagnosis not present

## 2016-09-20 DIAGNOSIS — Z885 Allergy status to narcotic agent status: Secondary | ICD-10-CM | POA: Insufficient documentation

## 2016-09-20 DIAGNOSIS — Z86010 Personal history of colonic polyps: Secondary | ICD-10-CM | POA: Insufficient documentation

## 2016-09-20 DIAGNOSIS — M5412 Radiculopathy, cervical region: Secondary | ICD-10-CM | POA: Diagnosis not present

## 2016-09-20 DIAGNOSIS — K5730 Diverticulosis of large intestine without perforation or abscess without bleeding: Secondary | ICD-10-CM | POA: Insufficient documentation

## 2016-09-20 DIAGNOSIS — Z1211 Encounter for screening for malignant neoplasm of colon: Secondary | ICD-10-CM | POA: Diagnosis present

## 2016-09-20 DIAGNOSIS — Z9889 Other specified postprocedural states: Secondary | ICD-10-CM | POA: Insufficient documentation

## 2016-09-20 DIAGNOSIS — K219 Gastro-esophageal reflux disease without esophagitis: Secondary | ICD-10-CM | POA: Diagnosis not present

## 2016-09-20 DIAGNOSIS — Z87442 Personal history of urinary calculi: Secondary | ICD-10-CM | POA: Diagnosis not present

## 2016-09-20 DIAGNOSIS — Z9079 Acquired absence of other genital organ(s): Secondary | ICD-10-CM | POA: Insufficient documentation

## 2016-09-20 DIAGNOSIS — I10 Essential (primary) hypertension: Secondary | ICD-10-CM | POA: Diagnosis not present

## 2016-09-20 HISTORY — PX: COLONOSCOPY WITH PROPOFOL: SHX5780

## 2016-09-20 HISTORY — DX: Personal history of urinary calculi: Z87.442

## 2016-09-20 SURGERY — COLONOSCOPY WITH PROPOFOL
Anesthesia: Monitor Anesthesia Care

## 2016-09-20 MED ORDER — LIDOCAINE 2% (20 MG/ML) 5 ML SYRINGE
INTRAMUSCULAR | Status: AC
  Filled 2016-09-20: qty 5

## 2016-09-20 MED ORDER — LACTATED RINGERS IV SOLN
INTRAVENOUS | Status: DC
  Administered 2016-09-20: 1000 mL via INTRAVENOUS

## 2016-09-20 MED ORDER — LIDOCAINE 2% (20 MG/ML) 5 ML SYRINGE
INTRAMUSCULAR | Status: DC | PRN
  Administered 2016-09-20: 60 mg via INTRAVENOUS

## 2016-09-20 MED ORDER — PROPOFOL 10 MG/ML IV BOLUS
INTRAVENOUS | Status: DC | PRN
  Administered 2016-09-20: 20 mg via INTRAVENOUS
  Administered 2016-09-20: 40 mg via INTRAVENOUS
  Administered 2016-09-20: 20 mg via INTRAVENOUS
  Administered 2016-09-20: 40 mg via INTRAVENOUS
  Administered 2016-09-20 (×12): 20 mg via INTRAVENOUS

## 2016-09-20 MED ORDER — ONDANSETRON HCL 4 MG/2ML IJ SOLN
INTRAMUSCULAR | Status: AC
  Filled 2016-09-20: qty 2

## 2016-09-20 MED ORDER — SODIUM CHLORIDE 0.9 % IV SOLN: INTRAVENOUS | Status: DC

## 2016-09-20 MED ORDER — PROPOFOL 10 MG/ML IV BOLUS
INTRAVENOUS | Status: AC
  Filled 2016-09-20: qty 60

## 2016-09-20 SURGICAL SUPPLY — 22 items

## 2016-09-20 NOTE — H&P (Signed)
Procedure: Surveillance colonoscopy. 11/12/2008 normal surveillance colonoscopy was performed. August 2005 colonoscopy was performed with removal of a diminutive tubular adenomatous polyp  History: The patient is a 66 year old male born May 01, 1950. He is scheduled to undergo a surveillance colonoscopy today.  Past medical history: Kidney stones. Hypertension. Depression. Cervical radiculopathy. Gastroesophageal reflux. Tinnitus. Prostatectomy for prostate cancer. Tonsillectomy. Lithotripsy for kidney stones.  Medication allergies: Codeine  Exam: The patient is alert and lying comfortably on the endoscopy stretcher. Abdomen is soft and nontender to palpation. Lungs are clear to auscultation. Cardiac exam reveals a regular rhythm.  Plan: Proceed with surveillance colonoscopy

## 2016-09-20 NOTE — Transfer of Care (Signed)
Immediate Anesthesia Transfer of Care Note  Patient: Dan Medina  Procedure(s) Performed: Procedure(s): COLONOSCOPY WITH PROPOFOL (N/A)  Patient Location: PACU  Anesthesia Type:MAC  Level of Consciousness:  sedated, patient cooperative and responds to stimulation  Airway & Oxygen Therapy:Patient Spontanous Breathing and Patient connected to face mask oxgen  Post-op Assessment:  Report given to PACU RN and Post -op Vital signs reviewed and stable  Post vital signs:  Reviewed and stable  Last Vitals:  Vitals:   09/20/16 0933  BP: 133/85  Pulse: 73  Resp: 12  Temp: 46.9 C    Complications: No apparent anesthesia complications

## 2016-09-20 NOTE — Anesthesia Postprocedure Evaluation (Signed)
Anesthesia Post Note  Patient: Eliot Popper  Procedure(s) Performed: Procedure(s) (LRB): COLONOSCOPY WITH PROPOFOL (N/A)     Patient location during evaluation: PACU Anesthesia Type: MAC Level of consciousness: awake and alert Pain management: pain level controlled Vital Signs Assessment: post-procedure vital signs reviewed and stable Respiratory status: spontaneous breathing, nonlabored ventilation, respiratory function stable and patient connected to nasal cannula oxygen Cardiovascular status: stable and blood pressure returned to baseline Anesthetic complications: no    Last Vitals:  Vitals:   09/20/16 1130 09/20/16 1140  BP: 124/68 134/71  Pulse: 82 79  Resp: 17 15  Temp:      Last Pain:  Vitals:   09/20/16 0933  TempSrc: Oral                 Effie Berkshire

## 2016-09-20 NOTE — Discharge Instructions (Signed)

## 2016-09-20 NOTE — Op Note (Signed)
[facility] Patient Name: Dan Medina Procedure Date: 09/20/2016 MRN: [id number] Attending MD: Garlan Fair , MD Date of Birth: [birth year] CSN: [id number] Age: 66 Admit Type: Outpatient Procedure:                Colonoscopy Indications:              Screening for colorectal malignant neoplasm.                            11/12/2008 normal surveillance colonoscopy was                            performed. August 2005 colonoscopy was performed                            with removal of a diminutive tubular adenomatous                            polyp Providers:                Garlan Fair, MD, Kingsley Plan, RN, Lillie Fragmin, RN, Tinnie Gens, Technician Referring MD:              Medicines:                Propofol per Anesthesia Complications:            No immediate complications. Estimated Blood Loss:     Estimated blood loss: none. Procedure:                Pre-Anesthesia Assessment:                           - Prior to the procedure, a History and Physical                            was performed, and patient medications and                            allergies were reviewed. The patient's tolerance of                            previous anesthesia was also reviewed. The risks                            and benefits of the procedure and the sedation                            options and risks were discussed with the patient.                            All questions were answered, and informed consent                            was obtained. Prior Anticoagulants: The  patient has                            taken no previous anticoagulant or antiplatelet                            agents. ASA Grade Assessment: II - A patient with                            mild systemic disease. After reviewing the risks                            and benefits, the patient was deemed in                            satisfactory condition to  undergo the procedure.                           After obtaining informed consent, the colonoscope                            was passed under direct vision. Throughout the                            procedure, the patient's blood pressure, pulse, and                            oxygen saturations were monitored continuously. The                            EC-[id number] ([id number]) scope was introduced through                            the anus and advanced to the the cecum, identified                            by appendiceal orifice and ileocecal valve. The                            colonoscopy was performed without difficulty. The                            patient tolerated the procedure well. The quality                            of the bowel preparation was good. The appendiceal                            orifice and the rectum were photographed. Scope In: 10:54:59 AM Scope Out: 11:18:03 AM Scope Withdrawal Time: 0 hours 11 minutes 29 seconds  Total Procedure Duration: 0 hours 23 minutes 4 seconds  Findings:      The perianal and digital rectal examinations were normal.      The entire examined colon appeared normal. Left colonic diverticulosis  was present Impression:               - The entire examined colon is normal.                           - No specimens collected. Moderate Sedation:      N/A- Per Anesthesia Care Recommendation:           - Patient has a contact number available for                            emergencies. The signs and symptoms of potential                            delayed complications were discussed with the                            patient. Return to normal activities tomorrow.                            Written discharge instructions were provided to the                            patient.                           - Repeat colonoscopy in 10 years for screening                            purposes.                           - Resume previous  diet.                           - Continue present medications. Procedure Code(s):        --- Professional ---                           R4163, Colorectal cancer screening; colonoscopy on                            individual not meeting criteria for high risk Diagnosis Code(s):        --- Professional ---                           Z12.11, Encounter for screening for malignant                            neoplasm of colon CPT copyright 2016 American Medical Association. All rights reserved. The codes documented in this report are preliminary and upon coder review may  be revised to meet current compliance requirements. Earle Gell, MD Garlan Fair, MD 09/20/2016 11:23:56 AM This report has been signed electronically. Number of Addenda: 0

## 2016-09-20 NOTE — Anesthesia Preprocedure Evaluation (Addendum)
Anesthesia Evaluation  Patient identified by MRN, date of birth, ID band Patient awake    Reviewed: Allergy & Precautions, NPO status , Patient's Chart, lab work & pertinent test results  Airway Mallampati: II  TM Distance: >3 FB Neck ROM: Full    Dental no notable dental hx.    Pulmonary    Pulmonary exam normal        Cardiovascular hypertension, Pt. on medications  Rhythm:Regular Rate:Normal     Neuro/Psych PSYCHIATRIC DISORDERS Anxiety Depression  Neuromuscular disease    GI/Hepatic Neg liver ROS, GERD  Medicated,  Endo/Other  negative endocrine ROS  Renal/GU negative Renal ROS  negative genitourinary   Musculoskeletal negative musculoskeletal ROS (+)   Abdominal   Peds negative pediatric ROS (+)  Hematology negative hematology ROS (+)   Anesthesia Other Findings - Prostate Cancer  Reproductive/Obstetrics negative OB ROS                            Anesthesia Physical Anesthesia Plan  ASA: II  Anesthesia Plan: MAC   Post-op Pain Management:    Induction: Intravenous  PONV Risk Score and Plan: 0 and Propofol  Airway Management Planned:   Additional Equipment:   Intra-op Plan:   Post-operative Plan:   Informed Consent: I have reviewed the patients History and Physical, chart, labs and discussed the procedure including the risks, benefits and alternatives for the proposed anesthesia with the patient or authorized representative who has indicated his/her understanding and acceptance.     Plan Discussed with:   Anesthesia Plan Comments:         Anesthesia Quick Evaluation

## 2016-09-22 ENCOUNTER — Encounter (HOSPITAL_COMMUNITY): Payer: Self-pay | Admitting: Gastroenterology

## 2017-08-23 ENCOUNTER — Encounter

## 2017-08-23 ENCOUNTER — Ambulatory Visit: Payer: Self-pay

## 2017-08-23 ENCOUNTER — Ambulatory Visit: Payer: BC Managed Care – PPO | Admitting: Podiatry

## 2017-08-23 ENCOUNTER — Encounter: Payer: Self-pay | Admitting: Podiatry

## 2017-08-23 VITALS — BP 136/77 | HR 82

## 2017-08-23 DIAGNOSIS — M216X1 Other acquired deformities of right foot: Secondary | ICD-10-CM

## 2017-08-23 DIAGNOSIS — W458XXA Other foreign body or object entering through skin, initial encounter: Secondary | ICD-10-CM

## 2017-08-23 DIAGNOSIS — Q828 Other specified congenital malformations of skin: Secondary | ICD-10-CM | POA: Diagnosis not present

## 2017-08-23 NOTE — Progress Notes (Signed)
This patient presents to the office with chief complaint of a probable foreign body in his right forefoot.  He says he and his wife both remember walking in the kitchen and knowing there was glass on the floor.  He believes this glass end into his right forefoot. He believes he stepped on the glass over 6 months ago.  He says he experiences throbbing pain  at this site after a days activity. when he  Sleeps.  He presents the office today for an evaluation of this painful area on the bottom of his right foot.   General Appearance  Alert, conversant and in no acute stress.  Vascular  Dorsalis pedis and posterior tibial  pulses are palpable  bilaterally.  Capillary return is within normal limits  bilaterally. Temperature is within normal limits  bilaterally.  Neurologic  Senn-Weinstein monofilament wire test within normal limits  bilaterally. Muscle power within normal limits bilaterally.  Nails Thick disfigured discolored nails with subungual debris  from hallux to fifth toes bilaterally. No evidence of bacterial infection or drainage bilaterally.  Orthopedic  No limitations of motion of motion feet .  No crepitus or effusions noted.  No bony pathology or digital deformities noted. Plantar flexed fifth metatarsal right foot.    Skin  normotropic skin with no porokeratosis noted bilaterally.  No signs of infections or ulcers noted.  Porokeratosis right foot sub 5th metatarsal.   Porokeratosis secondary to plantar flexed fifth metatarsal right foot.  IE  Debride porokeratosis.  Discussed condition with this patient.  I explained the skin lesion was the result of prominent metatarsal head, right foot.  Patient was told to return to the office when necessary   Gardiner Barefoot DPM

## 2019-05-07 ENCOUNTER — Ambulatory Visit: Payer: BC Managed Care – PPO

## 2019-05-13 ENCOUNTER — Ambulatory Visit: Payer: BC Managed Care – PPO | Attending: Internal Medicine

## 2019-05-13 DIAGNOSIS — Z23 Encounter for immunization: Secondary | ICD-10-CM

## 2019-05-13 NOTE — Progress Notes (Signed)
   Covid-19 Vaccination Clinic  Name:  Dan Medina    MRN: [id number] DOB: [birth year]  05/13/2019  [name] was observed post Covid-19 immunization for 15 minutes without incidence. He was provided with Vaccine Information Sheet and instruction to access the V-Safe system.   [name] was instructed to call 911 with any severe reactions post vaccine: Marland Kitchen Difficulty breathing  . Swelling of your face and throat  . A fast heartbeat  . A bad rash all over your body  . Dizziness and weakness    Immunizations Administered    Name Date Dose VIS Date Route   Pfizer COVID-19 Vaccine 05/13/2019  1:01 PM 0.3 mL 03/16/2019 Intramuscular   Manufacturer: [place]   Lot: [id number]   [place]: [id number]

## 2019-06-06 ENCOUNTER — Ambulatory Visit: Payer: BC Managed Care – PPO

## 2019-06-06 ENCOUNTER — Ambulatory Visit: Payer: BC Managed Care – PPO | Attending: Internal Medicine

## 2019-06-06 DIAGNOSIS — Z23 Encounter for immunization: Secondary | ICD-10-CM

## 2019-06-06 NOTE — Progress Notes (Signed)
   Covid-19 Vaccination Clinic  Name:  Dan Medina    MRN: [id number] DOB: 03/28/1951  06/06/2019  [name] was observed post Covid-19 immunization for 15 minutes without incident. He was provided with Vaccine Information Sheet and instruction to access the V-Safe system.   [name] was instructed to call 911 with any severe reactions post vaccine: Marland Kitchen Difficulty breathing  . Swelling of face and throat  . A fast heartbeat  . A bad rash all over body  . Dizziness and weakness   Immunizations Administered    Name Date Dose VIS Date Route   Pfizer COVID-19 Vaccine 06/06/2019  4:12 PM 0.3 mL 03/16/2019 Intramuscular   Manufacturer: [place]   Lot: [id number]   [place]: [id number]

## 2020-04-08 ENCOUNTER — Other Ambulatory Visit: Payer: Self-pay

## 2020-04-08 DIAGNOSIS — Z20822 Contact with and (suspected) exposure to covid-19: Secondary | ICD-10-CM

## 2020-04-09 LAB — SARS-COV-2, NAA 2 DAY TAT

## 2020-04-09 LAB — NOVEL CORONAVIRUS, NAA: SARS-CoV-2, NAA: NOT DETECTED

## 2020-12-23 ENCOUNTER — Other Ambulatory Visit: Payer: Self-pay | Admitting: Internal Medicine

## 2020-12-23 DIAGNOSIS — I10 Essential (primary) hypertension: Secondary | ICD-10-CM

## 2021-01-13 ENCOUNTER — Other Ambulatory Visit: Payer: BC Managed Care – PPO

## 2021-01-16 ENCOUNTER — Ambulatory Visit
Admission: RE | Admit: 2021-01-16 | Discharge: 2021-01-16 | Disposition: A | Discharge status: Home / Self Care | Payer: No Typology Code available for payment source | Source: Ambulatory Visit | Attending: Internal Medicine | Admitting: Internal Medicine

## 2021-01-16 DIAGNOSIS — I10 Essential (primary) hypertension: Secondary | ICD-10-CM

## 2021-06-22 ENCOUNTER — Ambulatory Visit (INDEPENDENT_AMBULATORY_CARE_PROVIDER_SITE_OTHER): Payer: BC Managed Care – PPO | Admitting: Internal Medicine

## 2021-06-22 ENCOUNTER — Encounter: Payer: Self-pay | Admitting: Internal Medicine

## 2021-06-22 ENCOUNTER — Other Ambulatory Visit: Payer: Self-pay

## 2021-06-22 VITALS — BP 110/68 | HR 71 | Ht 68.0 in | Wt 159.0 lb

## 2021-06-22 DIAGNOSIS — I10 Essential (primary) hypertension: Secondary | ICD-10-CM

## 2021-06-22 DIAGNOSIS — R931 Abnormal findings on diagnostic imaging of heart and coronary circulation: Secondary | ICD-10-CM

## 2021-06-22 DIAGNOSIS — E785 Hyperlipidemia, unspecified: Secondary | ICD-10-CM

## 2021-06-22 NOTE — Progress Notes (Signed)
? ? ?LIPID CLINIC CONSULT NOTE ? ?Chief Complaint:  ?Manage dyslipidemia ? ?Primary Care Physician: ?Dan Huddle, MD ? ?Primary Cardiologist:  ?None ? ?HPI:  ?Dan Medina is a 71 y.o. male who is being seen today for the evaluation of dyslipidemia at the request of Dan Huddle, MD. 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  Recently underwent further restratification with a coronary calcium score that was elevated at 342, 69 percentile for age and sex matched controls.  This showed multivessel coronary calcium and left main coronary calcium.  He is active and walks regularly and denies any chest pain or worsening shortness of breath.  He had an LDL particle test recently which showed LDL-P of 1264, LDL-C of 117 and small LDL-C of 527.  This is in the setting of atorvastatin 40 mg which she had previously taken 3 times a week but recently increased to 4 times a week.  He had thought that he had had leg pain associated with this however symptoms have not really changed.  Despite this, he would remain well above target LDL less than 70 and needs more aggressive lipid management.  He notes that there is a strong family history of heart disease including grandfather who had coronary disease, father did live into his 30s, and 2 siblings who had TIAs. ? ?PMHx:  ?Past Medical History:  ?Diagnosis Date  ? Anxiety   ? Cervical radiculopathy, chronic   ? Depression   ? Esophageal reflux   ? Family history of adverse reaction to anesthesia   ? family members slow to wake up   ? History of kidney stones   ? Hypertension   ? Lumbar radiculopathy   ? Prostate cancer ([place]) 05/07/14  ? Gleason 7, volume 49.3 cc  ? ? ?Past Surgical History:  ?Procedure Laterality Date  ? COLONOSCOPY WITH PROPOFOL N/A 09/20/2016  ? Procedure: COLONOSCOPY WITH PROPOFOL;  Surgeon: Dan Fair, MD;  Location: [facility] END[facility]COPY;  Service: Endoscopy;  Laterality: N/A;  ?  cystoscopy with ureteroscopy  2006  ? LITHOTRIPSY  2008  ? LYMPHADENECTOMY Bilateral 08/05/2014  ? Procedure: LYMPHADENECTOMY;  Surgeon: Dan Bring, MD;  Location: [facility] ORS;  Service: Urology;  Laterality: Bilateral;  ? PROSTATE BIOPSY  05/07/14  ? Gleason 7, volume 49.3 cc  ? ROBOT ASSISTED LAPAROSCOPIC RADICAL PROSTATECTOMY N/A 08/05/2014  ? Procedure: ROBOTIC ASSISTED LAPAROSCOPIC RADICAL PROSTATECTOMY LEVEL 2;  Surgeon: Dan Bring, MD;  Location: [facility] ORS;  Service: Urology;  Laterality: N/A;  ? TONSILLECTOMY    ? ? ?FAMHx:  ?Family History  ?Problem Relation Age of Onset  ? Cancer Mother   ?     lung  ? ? ?SOCHx:  ? reports that he has never smoked. He has never used smokeless tobacco. He reports current alcohol use. He reports that he does not use drugs. ? ?ALLERGIES:  ?Allergies  ?Allergen Reactions  ? Codeine Rash  ? Codeine Phosphate Rash  ? ? ?ROS: ?Pertinent items noted in HPI and remainder of comprehensive ROS otherwise negative. ? ?HOME MEDS: ?Current Outpatient Medications on File Prior to Visit  ?Medication Sig Dispense Refill  ? aspirin 81 MG chewable tablet 1 tablet    ? atorvastatin (LIPITOR) 40 MG tablet Take 40 mg by mouth daily.    ? cholecalciferol (VITAMIN D) 25 MCG (1000 UNIT) tablet 1 capsule    ? Coenzyme Q10 (CO Q-10) 100 MG CAPS See admin instructions.    ?  omeprazole (PRILOSEC) 20 MG capsule Take 20 mg by mouth daily as needed (heartburn).     ? sildenafil (REVATIO) 20 MG tablet 1 tablet    ? valsartan-hydrochlorothiazide (DIOVAN-HCT) 160-12.5 MG per tablet Take 1 tablet by mouth every morning.     ? ?No current facility-administered medications on file prior to visit.  ? ? ?LABS/IMAGING: ?No results found for this or any previous visit (from the past 48 hour(s)). ?No results found. ? ?LIPID PANEL: ?No results found for: CHOL, TRIG, HDL, CHOLHDL, VLDL, LDLCALC, LDLDIRECT ? ?WEIGHTS: ?Wt Readings from Last 3 Encounters:  ?06/22/21 159 lb (72.1 kg)  ?09/20/16 164 lb (74.4 kg)  ?08/05/14 164 lb  (74.4 kg)  ? ? ?VITALS: ?BP 110/68   Pulse 71   Ht '5\' 8"'$  (1.727 m)   Wt 159 lb (72.1 kg)   SpO2 99%   BMI 24.18 kg/m?  ? ?EXAM: ?General appearance: alert and no distress ?Neck: no carotid bruit, no JVD, and thyroid not enlarged, symmetric, no tenderness/mass/nodules ?Lungs: clear to auscultation bilaterally ?Heart: regular rate and rhythm, S1, S2 normal, no murmur, click, rub or gallop ?Abdomen: soft, non-tender; bowel sounds normal; no masses,  no organomegaly ?Extremities: extremities normal, atraumatic, no cyanosis or edema ?Pulses: 2+ and symmetric ?Skin: Skin color, texture, turgor normal. No rashes or lesions ?Neurologic: Grossly normal ?Psych: Pleasant ? ?EKG: ?Normal sinus rhythm at 71- personally reviewed ? ?ASSESSMENT: ?Abnormal CAC score of 342, 69th percentile for age and sex matched controls ?Mixed dyslipidemia, goal LDL less than 70 ?Hypertension-controlled ?Family history of premature ASCVD ? ?PLAN: ?1.   Dan Medina had an abnormal calcium score which puts him at 69th percentile for age and sex matched controls greater than 300.  He has a mixed dyslipidemia but remains well above a goal LDL less than 70 despite compliance with high intensity atorvastatin 40 mg.  He thinks he may be able to take it daily although is already taking it 4 times a week.  This change will be minimal and not get him to target.  He will need additional therapy.  We discussed a PCSK9 inhibitor which I think is an excellent option for him.  I like to get prior authorization for that to try to target his LDL lower.  We will go ahead and then repeat a lipid NMR and LP(a) which may be elevated as well.  Plan follow-up with me in about 3 to 4 months.  I have encouraged continued healthy diet and more regular physical activity. ? ?Thanks for the kind referral. ? ?Dan Casino, MD, [facility], FACP  ?Dan Medina  ?Medical Director of the [facility]  ?[facility]  Cl[facility] ?Diplomate of the Dan Medina of Clinical Lipidology ?Attending Cardiologist  ?Direct Dial: [phone]  Fax: [phone]  ?Website:  [email or web address] ? ?Nadean Corwin Daimon Kean ?06/22/2021, 9:31 AM ?

## 2021-06-22 NOTE — Patient Instructions (Signed)
Medication Instructions:  ?Dr. Debara Pickett recommends Repatha '140mg'$ /mL or Praluent '150mg'$ /mL (PCSK9). This is an injectable cholesterol medication self-administered once every 14 days. This medication will likely need prior approval with your insurance company, which we will work on. If the medication is not approved initially, we may need to do an appeal with your insurance.  ? ?Administer medication in area of fatty tissue such as abdomen, outer thigh, back of upper arm - and rotate site with each injection ?Store medication in refrigerator until ready to administer - allow to sit at room temp for 30 mins - 1 hour prior to injection ?Dispose of medication in a SHARPS container - your pharmacy should be able to direct you on this and proper disposal  ? ?If you need a co-pay card for Repatha: [email or web address]-[email or web address] >> paying for Repatha or red box that says "Repatha Copay Card" in top right ?If you need a co-pay card for Praluent: WedMap.it >> starting & paying for Praluent ? ?Patient Assistance:   ?The PAN Foundation: https://www.panfoundation.org/disease-funds/hypercholesterolemia/ ?-- can sign up for wait list ? ?The [employer] Well foundation offers assistance to help pay for medication copays.  They will cover copays for all cholesterol lowering meds, including statins, fibrates, omega-3 fish oils like Vascepa, ezetimibe, Repatha, Praluent, Nexletol, Nexlizet.  The cards are usually good for $2,500 or 12 months, whichever comes first. ?Go to healthwellfoundation.org ?Click on ?Apply Now? ?Answer questions as to whom is applying (patient or representative) ?Your disease fund will be ?hypercholesterolemia - Medicare access? ?They will ask questions about finances and which medications you are taking for cholesterol ?When you submit, the approval is usually within minutes.  You will need to print the card information from the site ?You will need to show this information to your pharmacy, they will bill your Medicare Part D plan  first -then bill Health Well --for the copay.   ?You can also call them at [phone], although the hold times can be quite long.  ? ? ? ?*If you need a refill on your cardiac medications before your next appointment, please call your pharmacy* ? ? ?Lab Work: ?FASTING lab work to check cholesterol in about 3-4 months ?-- complete about 1 week before your next visit with Dr. Debara Pickett  ? ?If you have labs (blood work) drawn today and your tests are completely normal, you will receive your results only by: ?MyChart Message (if you have MyChart) OR ?A paper copy in the mail ?If you have any lab test that is abnormal or we need to change your treatment, we will call you to review the results. ? ? ?Testing/Procedures: ?NONE ? ? ?Follow-Up: ?At [facility], you and your health needs are our priority.  As part of our continuing mission to provide you with exceptional heart care, we have created designated Provider Care Teams.  These Care Teams include your primary Cardiologist (physician) and Advanced Practice Providers (APPs -  Physician Assistants and Nurse Practitioners) who all work together to provide you with the care you need, when you need it. ? ?We recommend signing up for the patient portal called "MyChart".  Sign up information is provided on this After Visit Summary.  MyChart is used to connect with patients for Virtual Visits (Telemedicine).  Patients are able to view lab/test results, encounter notes, upcoming appointments, etc.  Non-urgent messages can be sent to your provider as well.   ?To learn more about what you can do with MyChart, go to [email or web address].   ? ?Your next appointment:   ?  3-4 month(s) ? ?The format for your next appointment:   ?In Person ? ?Provider:   ?Dr. Lyman Bishop ? ?

## 2021-06-23 ENCOUNTER — Telehealth: Payer: Self-pay | Admitting: Internal Medicine

## 2021-06-23 MED ORDER — PRALUENT 150 MG/ML ~~LOC~~ SOAJ: 1.0000 | SUBCUTANEOUS | 11 refills | Status: DC

## 2021-06-23 NOTE — Telephone Encounter (Signed)
[facility]? received a request from your prescriber for coverage of Praluent. ?As long as you remain covered by the [facility] and there are no changes to ?your plan benefits, this request is approved for the following time period: ?06/23/2021 - 06/24/2022 ?

## 2021-06-23 NOTE — Telephone Encounter (Signed)
PA for Praluent '150mg'$ /mL submitted via CMM ?Preferred w/insurance  ?(Key: [id number]) ?

## 2021-06-23 NOTE — Addendum Note (Signed)
Addended by: Fidel Levy on: 06/23/2021 08:48 AM ? ? Modules accepted: Orders ? ?

## 2021-07-02 ENCOUNTER — Encounter: Payer: Self-pay | Admitting: Internal Medicine

## 2021-09-14 ENCOUNTER — Ambulatory Visit: Payer: BC Managed Care – PPO | Admitting: Dermatology

## 2021-10-26 ENCOUNTER — Encounter: Payer: Self-pay | Admitting: Internal Medicine

## 2021-10-27 MED ORDER — REPATHA SURECLICK 140 MG/ML ~~LOC~~ SOAJ: 1.0000 | SUBCUTANEOUS | 3 refills | Status: DC

## 2021-10-28 ENCOUNTER — Encounter: Payer: Self-pay | Admitting: Internal Medicine

## 2021-12-10 IMAGING — CT CT CARDIAC CORONARY ARTERY CALCIUM SCORE
3 series · 14 of 20 positions shown, 16 images · non-contrast
Comparison: None.

CLINICAL DATA: 69-year-old Caucasian male with history of
hyperlipidemia and hypertension.

EXAM:
CT CARDIAC CORONARY ARTERY CALCIUM SCORE
TECHNIQUE: Non-contrast imaging through the heart was performed using
prospective ECG gating. Image post processing was performed on an
independent workstation, allowing for quantitative analysis of the
heart and coronary arteries. Note that this exam targets the heart
and the chest was not imaged in its entirety.

[Series 2: calcium scoring 2.00 qr36 bestdiast 71% hrt calciu · axial · 0.42mm/px · z∈[+1607,+1715]mm · 4 of 90 slices shown]
[im 18/90  vessel]
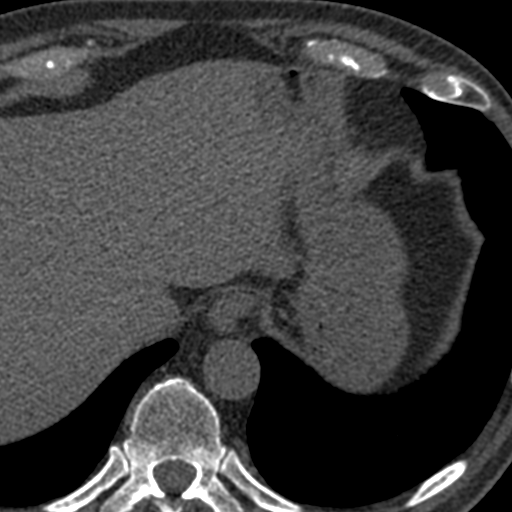
[im 36/90  vessel]
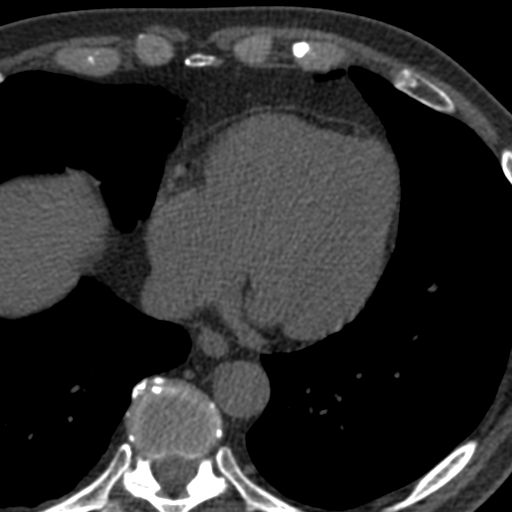
[im 54/90  vessel]
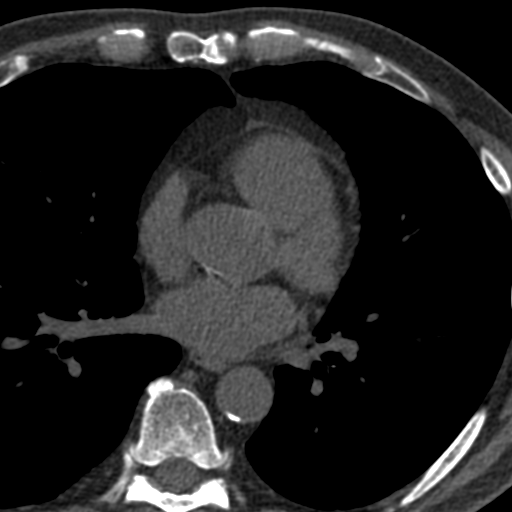
[im 72/90  vessel]
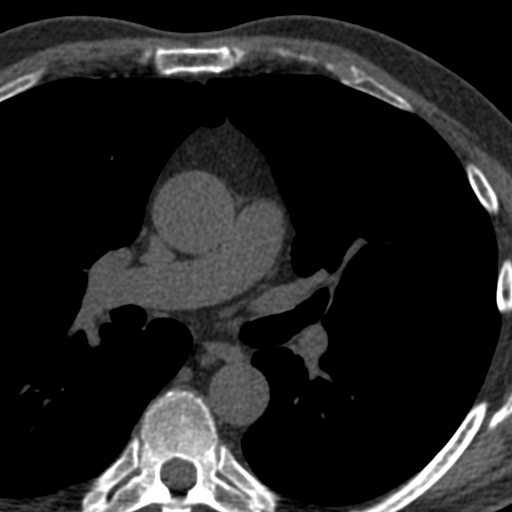

[Series 3: calcium scoring 2.00 br40 bestdiast 71% axial · axial · 0.59mm/px · z∈[+1601,+1721]mm · 5 of 90 slices shown, 7 images]
[im 15/90  vessel]
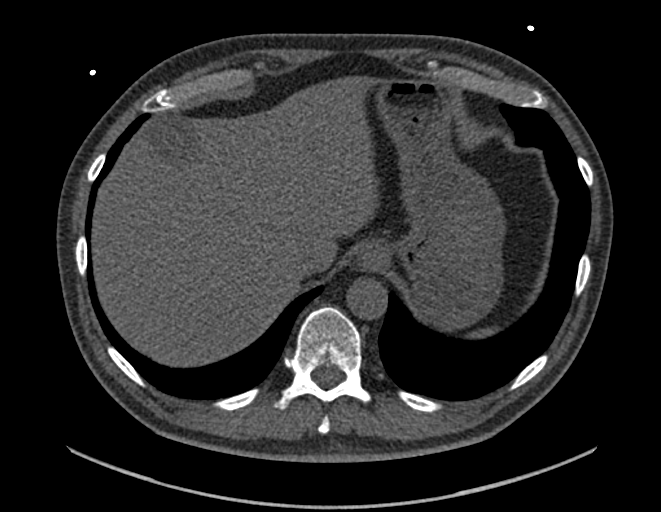
[im 15/90  lung]
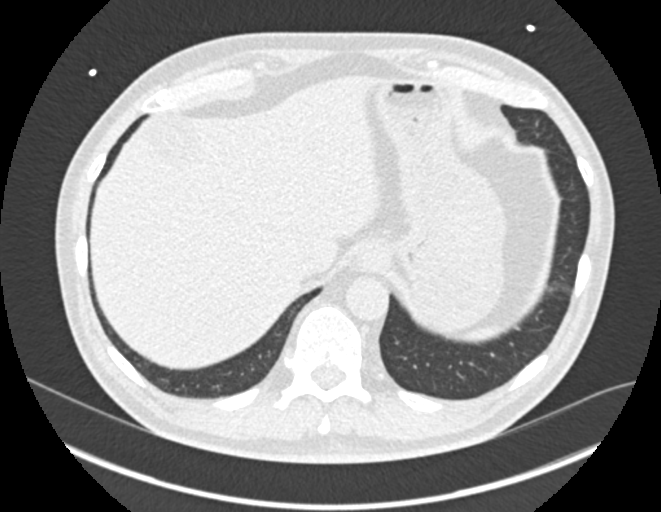
[im 30/90  vessel]
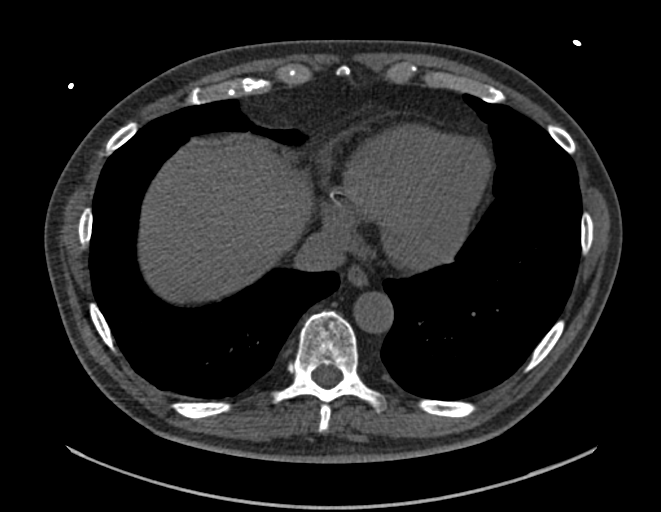
[im 45/90  vessel]
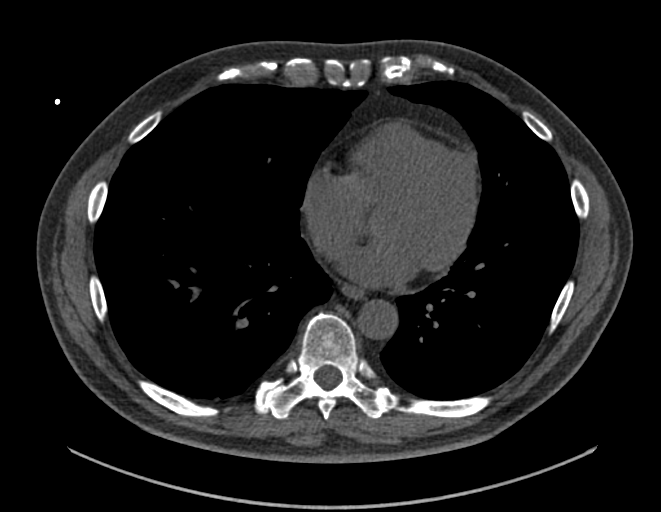
[im 60/90  vessel]
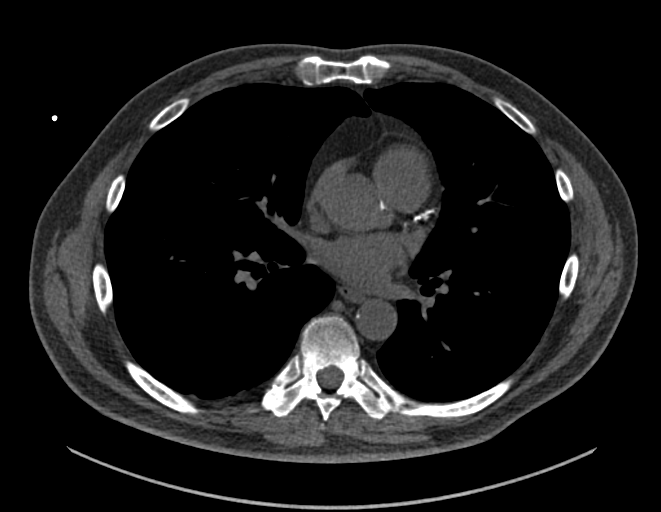
[im 75/90  vessel]
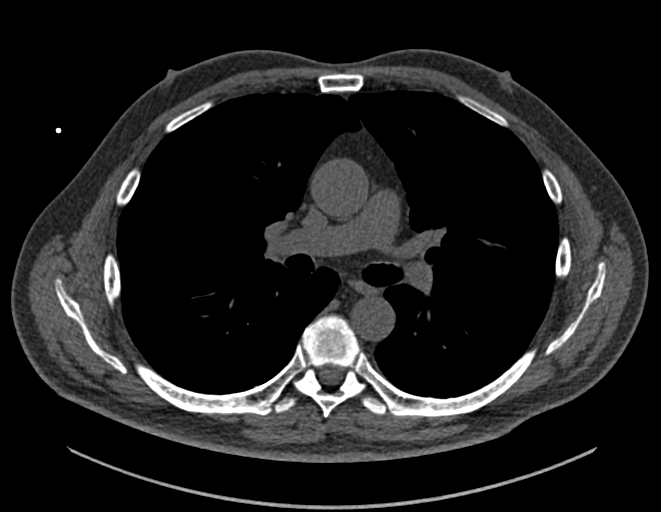
[im 75/90  lung]
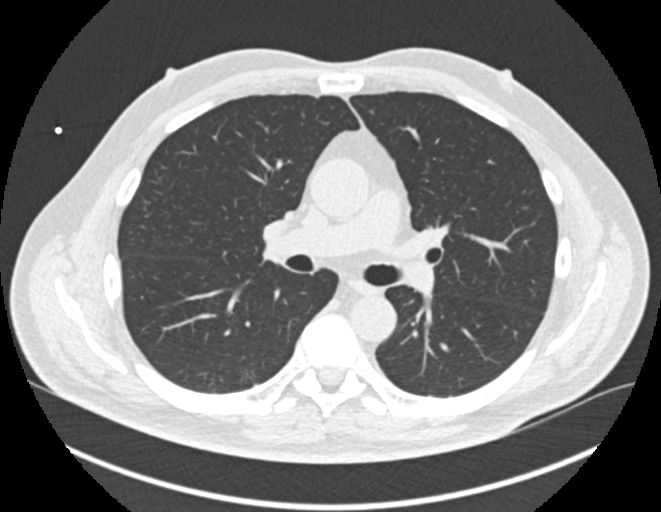

[Series 9: calcium scoring 2.00 br60 bestdiast 71% lungs · axial · 0.59mm/px · z∈[+1601,+1721]mm · 5 of 90 slices shown]
[im 15/90  vessel]
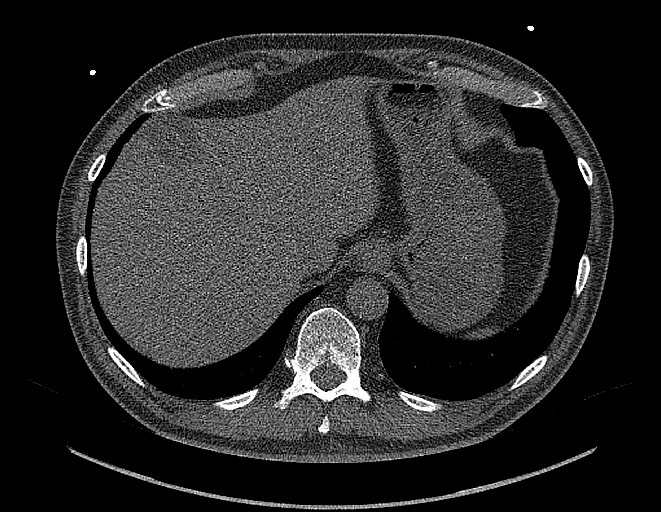
[im 30/90  vessel]
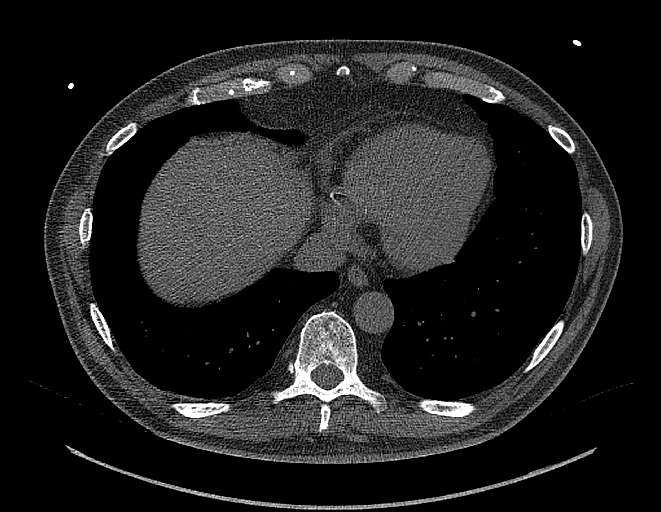
[im 45/90  vessel]
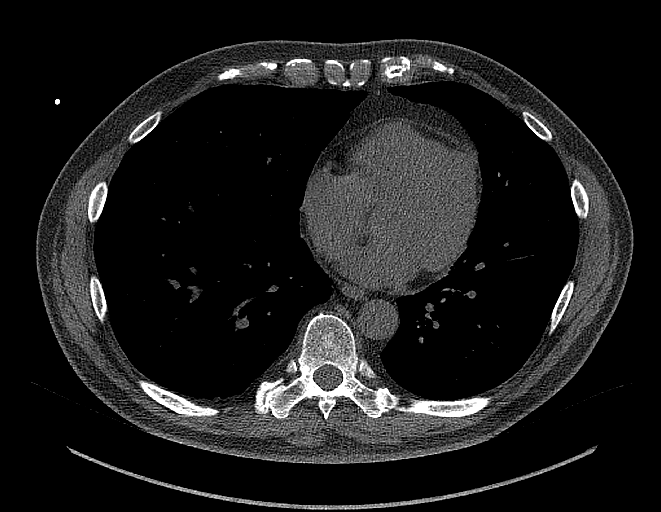
[im 60/90  vessel]
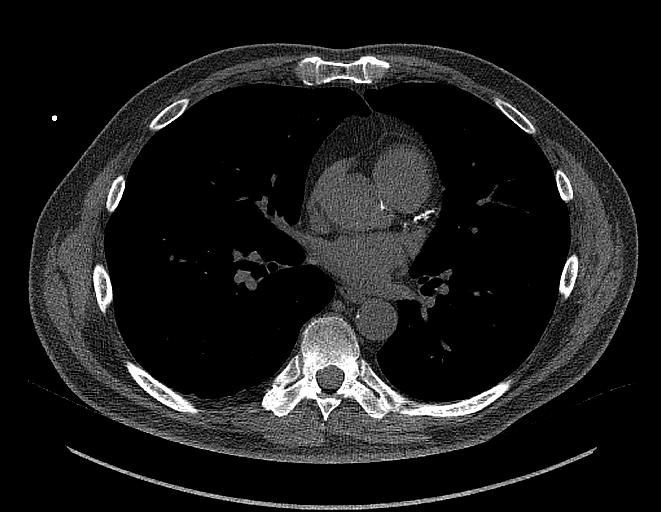
[im 75/90  vessel]
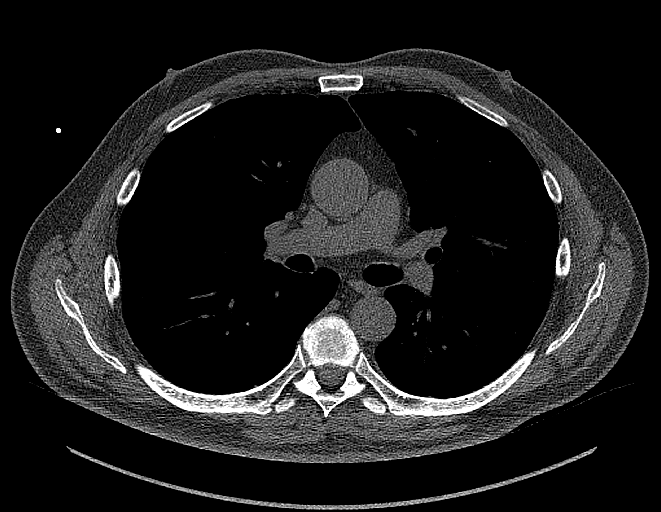

[14 of 20 positions shown; findings below may reference images not displayed]

FINDINGS: CORONARY CALCIUM SCORES:

Left Main:

LAD: 173

LCx:

RCA: 117

Total Agatston Score: 342

[HOSPITAL] percentile: 69

AORTA MEASUREMENTS:

Ascending Aorta: 33 mm

Descending Aorta: 25 mm

OTHER FINDINGS:

The heart size is within normal limits. No pericardial fluid is
identified. Visualized segments of the thoracic aorta and central
pulmonary arteries are normal in caliber. Visualized mediastinum and
hilar regions demonstrate no lymphadenopathy or masses. Visualized
lungs show no evidence of pulmonary edema, consolidation,
pneumothorax, nodule or pleural fluid. Visualized upper abdomen and
bony structures are unremarkable.
IMPRESSION: Coronary calcium score 342 is at the 69th percentile for the
patient's age, sex and race.

## 2022-03-02 ENCOUNTER — Ambulatory Visit: Payer: BC Managed Care – PPO | Admitting: Internal Medicine

## 2022-03-04 ENCOUNTER — Ambulatory Visit: Payer: BC Managed Care – PPO | Admitting: Internal Medicine

## 2022-04-07 ENCOUNTER — Other Ambulatory Visit: Payer: Self-pay | Admitting: Internal Medicine

## 2022-04-07 DIAGNOSIS — E785 Hyperlipidemia, unspecified: Secondary | ICD-10-CM

## 2022-04-15 LAB — NMR, LIPOPROFILE
Cholesterol, Total: 171 mg/dL (ref 100–199)
HDL Particle Number: 46.2 umol/L (ref >=30.5)
HDL-C: 67 mg/dL (ref >39)
LDL Particle Number: 807 nmol/L (ref <1000)
LDL Size: 20.4 nm — ABNORMAL LOW (ref >20.5)
LDL-C (NIH Calc): 83 mg/dL (ref 0–99)
LP-IR Score: 57 — ABNORMAL HIGH (ref <=45)
Small LDL Particle Number: 365 nmol/L (ref <=527)
Triglycerides: 120 mg/dL (ref 0–149)

## 2022-04-15 LAB — LIPOPROTEIN A (LPA): Lipoprotein (a): 9.9 nmol/L (ref <75.0)

## 2022-04-30 ENCOUNTER — Ambulatory Visit: Payer: BC Managed Care – PPO | Admitting: Internal Medicine

## 2022-05-26 NOTE — Telephone Encounter (Signed)
Spoke with patient who wishes to wait until his 06/2022 appointment to review labs and possible changes with Dr. Debara Pickett

## 2022-06-21 ENCOUNTER — Ambulatory Visit: Payer: BC Managed Care – PPO | Attending: Internal Medicine | Admitting: Internal Medicine

## 2022-06-21 ENCOUNTER — Encounter: Payer: Self-pay | Admitting: Internal Medicine

## 2022-06-21 VITALS — BP 124/82 | HR 87 | Ht 67.0 in | Wt 161.4 lb

## 2022-06-21 DIAGNOSIS — I10 Essential (primary) hypertension: Secondary | ICD-10-CM | POA: Diagnosis not present

## 2022-06-21 DIAGNOSIS — E785 Hyperlipidemia, unspecified: Secondary | ICD-10-CM

## 2022-06-21 DIAGNOSIS — R931 Abnormal findings on diagnostic imaging of heart and coronary circulation: Secondary | ICD-10-CM

## 2022-06-21 NOTE — Patient Instructions (Signed)
Medication Instructions:  NO CHANGES  *If you need a refill on your cardiac medications before your next appointment, please call your pharmacy*   Lab Work: FASTING NMR lipoprofile in 6 months  If you have labs (blood work) drawn today and your tests are completely normal, you will receive your results only by: [place] (if you have MyChart) OR A paper copy in the mail If you have any lab test that is abnormal or we need to change your treatment, we will call you to review the results.   Testing/Procedures: NONE   Follow-Up: At [facility], you and your health needs are our priority.  As part of our continuing mission to provide you with exceptional heart care, we have created designated Provider Care Teams.  These Care Teams include your primary Cardiologist (physician) and Advanced Practice Providers (APPs -  Physician Assistants and Nurse Practitioners) who all work together to provide you with the care you need, when you need it.  We recommend signing up for the patient portal called "MyChart".  Sign up information is provided on this After Visit Summary.  MyChart is used to connect with patients for Virtual Visits (Telemedicine).  Patients are able to view lab/test results, encounter notes, upcoming appointments, etc.  Non-urgent messages can be sent to your provider as well.   To learn more about what you can do with MyChart, go to [email or web address].    Your next appointment:    6 months with Dr. Debara Pickett

## 2022-06-21 NOTE — Progress Notes (Signed)
LIPID CLINIC CONSULT NOTE  Chief Complaint:  Manage dyslipidemia  Primary Care Physician: Josetta Huddle, MD  Primary Cardiologist:  None  HPI:  Dan Medina is a 72 y.o. male who is being seen today for the evaluation of dyslipidemia at the request of Josetta Huddle, MD. 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  Recently underwent further restratification with a coronary calcium score that was elevated at 342, 69 percentile for age and sex matched controls.  This showed multivessel coronary calcium and left main coronary calcium.  He is active and walks regularly and denies any chest pain or worsening shortness of breath.  He had an LDL particle test recently which showed LDL-P of 1264, LDL-C of 117 and small LDL-C of 527.  This is in the setting of atorvastatin 40 mg which she had previously taken 3 times a week but recently increased to 4 times a week.  He had thought that he had had leg pain associated with this however symptoms have not really changed.  Despite this, he would remain well above target LDL less than 70 and needs more aggressive lipid management.  He notes that there is a strong family history of heart disease including grandfather who had coronary disease, father did live into his 59s, and 2 siblings who had TIAs.  06/21/2022  Dan Medina returns today for follow-up.  He has had further improvement in his lipids on Praluent.  His LDL particle number is 807, LDL-C is 83, triglycerides were 120 and HDL of 67.  His LP(a) was negative at 9.9 nmol/L.  In all his numbers are quite a bit better.  His particle numbers even are more reflective of improvement.  LDL-C is still above goal less than 70.  We discussed approaches including dietary modifications or potential for additional therapy.  PMHx:  Past Medical History:  Diagnosis Date   Anxiety    Cervical radiculopathy, chronic    Depression     Esophageal reflux    Family history of adverse reaction to anesthesia    family members slow to wake up    History of kidney stones    Hypertension    Lumbar radiculopathy    Prostate cancer ([place]) 05/07/14   Gleason 7, volume 49.3 cc    Past Surgical History:  Procedure Laterality Date   COLONOSCOPY WITH PROPOFOL N/A 09/20/2016   Procedure: COLONOSCOPY WITH PROPOFOL;  Surgeon: Garlan Fair, MD;  Location: [facility];  Service: Endoscopy;  Laterality: N/A;   cystoscopy with ureteroscopy  2006   LITHOTRIPSY  2008   LYMPHADENECTOMY Bilateral 08/05/2014   Procedure: LYMPHADENECTOMY;  Surgeon: Raynelle Bring, MD;  Location: [facility];  Service: Urology;  Laterality: Bilateral;   PROSTATE BIOPSY  05/07/14   Gleason 7, volume 49.3 cc   ROBOT ASSISTED LAPAROSCOPIC RADICAL PROSTATECTOMY N/A 08/05/2014   Procedure: ROBOTIC ASSISTED LAPAROSCOPIC RADICAL PROSTATECTOMY LEVEL 2;  Surgeon: Raynelle Bring, MD;  Location: [facility];  Service: Urology;  Laterality: N/A;   TONSILLECTOMY      FAMHx:  Family History  Problem Relation Age of Onset   Cancer Mother        lung    SOCHx:   reports that he has never smoked. He has never used smokeless tobacco. He reports current alcohol use. He reports that he does not use drugs.  ALLERGIES:  Allergies  Allergen Reactions   Codeine Rash   Codeine Phosphate Rash  ROS: Pertinent items noted in HPI and remainder of comprehensive ROS otherwise negative.  HOME MEDS: Current Outpatient Medications on File Prior to Visit  Medication Sig Dispense Refill   aspirin 81 MG chewable tablet 1 tablet     cholecalciferol (VITAMIN D) 25 MCG (1000 UNIT) tablet 1 capsule     Coenzyme Q10 (CO Q-10) 100 MG CAPS See admin instructions.     Evolocumab (REPATHA SURECLICK) XX123456 MG/ML SOAJ Inject 1 Dose into the skin every 14 (fourteen) days. 6 mL 3   omeprazole (PRILOSEC) 20 MG capsule Take 20 mg by mouth daily as needed (heartburn).      sildenafil (REVATIO) 20 MG tablet  1 tablet     valsartan-hydrochlorothiazide (DIOVAN-HCT) 160-12.5 MG per tablet Take 1 tablet by mouth every morning.      No current facility-administered medications on file prior to visit.    LABS/IMAGING: No results found for this or any previous visit (from the past 48 hour(s)). No results found.  LIPID PANEL: No results found for: "CHOL", "TRIG", "HDL", "CHOLHDL", "VLDL", "LDLCALC", "LDLDIRECT"  WEIGHTS: Wt Readings from Last 3 Encounters:  06/21/22 161 lb 6.4 oz (73.2 kg)  06/22/21 159 lb (72.1 kg)  09/20/16 164 lb (74.4 kg)    VITALS: BP 124/82   Pulse 87   Ht 5\' 7"  (1.702 m)   Wt 161 lb 6.4 oz (73.2 kg)   SpO2 96%   BMI 25.28 kg/m   EXAM: Deferred  EKG: Deferred  ASSESSMENT: Abnormal CAC score of 342, 69th percentile for age and sex matched controls Mixed dyslipidemia, goal LDL less than 25 Hypertension-controlled Family history of premature ASCVD Negative LP(a)  PLAN: 1.   Mr. Grgurich has had substantial improvement in his lipids and is tolerating therapy well.  His LDL is still slightly above target although particle numbers have improved substantially.  LP(a) fortunately is negative.  Based on this, I have recommended continued work on diet and exercise over the next 6 months.  His cholesterol may reach target with more changes.  If we are not able to lower it more or his cholesterol does go up somewhat, then we could consider adding ezetimibe to his treatment regimen.  Overall I am pleased with his improvement and that he is tolerating the medication well.  Follow-up with me in 6 months with a lipid NMR.  Pixie Casino, MD, [facility], [place] Director of the Advanced Lipid Disorders &  Cardiovascular Risk Reduction Clinic Diplomate of the American Board of Clinical Lipidology Attending Cardiologist  Direct Dial: [phone]  Fax: [phone]  Website:  [email or web address] [email or web address] Codispoti 06/21/2022, 10:55 AM

## 2022-07-19 ENCOUNTER — Telehealth: Payer: Self-pay | Admitting: Internal Medicine

## 2022-07-19 NOTE — Telephone Encounter (Signed)
Pt c/o medication issue:  1. Name of Medication: Evolocumab (REPATHA SURECLICK) 140 MG/ML SOAJ   2. How are you currently taking this medication (dosage and times per day)?   3. Are you having a reaction (difficulty breathing--STAT)?   4. What is your medication issue? Marylene Land with the [employer] with [employer] states they faxed a PA on the medication, on Friday 4/12 to [phone]. Marylene Land would like a c/b regarding the status to [phone]. Reference #: [id number]. Please advise.

## 2022-07-20 NOTE — Telephone Encounter (Signed)
Your PA request has been approved. Additional information will be provided in the approval communication. (Message 1145). Authorization Expiration Date: July 20, 2023.

## 2022-07-20 NOTE — Telephone Encounter (Signed)
PA for Repatha submitted via CMM  (Key: [facility]) Rx #: [id number]

## 2022-07-20 NOTE — Telephone Encounter (Signed)
Could you please do PA for Repatha.  Looks like he has been on Praluent in the past.  On  plan.

## 2022-07-21 ENCOUNTER — Encounter: Payer: Self-pay | Admitting: Internal Medicine

## 2022-07-22 ENCOUNTER — Other Ambulatory Visit (HOSPITAL_COMMUNITY): Payer: Self-pay

## 2022-07-22 NOTE — Telephone Encounter (Signed)
PA approval on file. 07/20/22-07/20/23

## 2022-09-20 ENCOUNTER — Other Ambulatory Visit: Payer: Self-pay | Admitting: Internal Medicine

## 2022-11-05 ENCOUNTER — Encounter: Payer: Self-pay | Admitting: Internal Medicine

## 2022-11-05 ENCOUNTER — Ambulatory Visit: Payer: BC Managed Care – PPO | Admitting: Internal Medicine

## 2022-11-05 VITALS — BP 140/80 | HR 77 | Ht 68.0 in | Wt 159.2 lb

## 2022-11-05 DIAGNOSIS — I10 Essential (primary) hypertension: Secondary | ICD-10-CM

## 2022-11-05 DIAGNOSIS — E785 Hyperlipidemia, unspecified: Secondary | ICD-10-CM

## 2022-11-05 DIAGNOSIS — R931 Abnormal findings on diagnostic imaging of heart and coronary circulation: Secondary | ICD-10-CM | POA: Diagnosis not present

## 2022-11-05 NOTE — Patient Instructions (Signed)
Medication Instructions:  NO CHANGES  *If you need a refill on your cardiac medications before your next appointment, please call your pharmacy*    Follow-Up: At [facility], you and your health needs are our priority.  As part of our continuing mission to provide you with exceptional heart care, we have created designated Provider Care Teams.  These Care Teams include your primary Cardiologist (physician) and Advanced Practice Providers (APPs -  Physician Assistants and Nurse Practitioners) who all work together to provide you with the care you need, when you need it.  We recommend signing up for the patient portal called "MyChart".  Sign up information is provided on this After Visit Summary.  MyChart is used to connect with patients for Virtual Visits (Telemedicine).  Patients are able to view lab/test results, encounter notes, upcoming appointments, etc.  Non-urgent messages can be sent to your provider as well.   To learn more about what you can do with MyChart, go to [email or web address].    Your next appointment:    6 months with Dr. Debara Pickett

## 2022-11-05 NOTE — Progress Notes (Signed)
LIPID CLINIC CONSULT NOTE  Chief Complaint:  Manage dyslipidemia  Primary Care Physician: Marden Noble, MD (Inactive)  Primary Cardiologist:  None  HPI:  Dan Medina is a 72 y.o. male who is being seen today for the evaluation of dyslipidemia at the request of Marden Noble, MD. 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  Recently underwent further restratification with a coronary calcium score that was elevated at 342, 69 percentile for age and sex matched controls.  This showed multivessel coronary calcium and left main coronary calcium.  He is active and walks regularly and denies any chest pain or worsening shortness of breath.  He had an LDL particle test recently which showed LDL-P of 1264, LDL-C of 117 and small LDL-C of 527.  This is in the setting of atorvastatin 40 mg which she had previously taken 3 times a week but recently increased to 4 times a week.  He had thought that he had had leg pain associated with this however symptoms have not really changed.  Despite this, he would remain well above target LDL less than 70 and needs more aggressive lipid management.  He notes that there is a strong family history of heart disease including grandfather who had coronary disease, father did live into his 46s, and 2 siblings who had TIAs.  06/21/2022  Dan Medina returns today for follow-up.  He has had further improvement in his lipids on Praluent.  His LDL particle number is 807, LDL-C is 83, triglycerides were 120 and HDL of 67.  His LP(a) was negative at 9.9 nmol/L.  In all his numbers are quite a bit better.  His particle numbers even are more reflective of improvement.  LDL-C is still above goal less than 70.  We discussed approaches including dietary modifications or potential for additional therapy.  11/05/2022  Dan Medina returns today for follow-up.  He continues on the Repatha.  He denies any  significant side effects.  He said he had recent lab work at his PCP although was not clear whether he had lipids drawn or not.  He also related that he had a recent episode of posterior headache that occurred with sexual activity.  This abated.  He has not had that previously.  He said he had taken his blood pressure medicines.  He did not use the sildenafil prior to this episode.  He has not had headaches like this before.  He denied any strokelike symptoms.  PMHx:  Past Medical History:  Diagnosis Date   Anxiety    Cervical radiculopathy, chronic    Depression    Esophageal reflux    Family history of adverse reaction to anesthesia    family members slow to wake up    History of kidney stones    Hypertension    Lumbar radiculopathy    Prostate cancer ([facility]) 05/07/14   Gleason 7, volume 49.3 cc    Past Surgical History:  Procedure Laterality Date   COLONOSCOPY WITH PROPOFOL N/A 09/20/2016   Procedure: COLONOSCOPY WITH PROPOFOL;  Surgeon: Charolett Bumpers, MD;  Location: [facility];  Service: Endoscopy;  Laterality: N/A;   cystoscopy with ureteroscopy  2006   LITHOTRIPSY  2008   LYMPHADENECTOMY Bilateral 08/05/2014   Procedure: LYMPHADENECTOMY;  Surgeon: Heloise Purpura, MD;  Location: [facility];  Service: Urology;  Laterality: Bilateral;   PROSTATE BIOPSY  05/07/14   Gleason 7, volume 49.3 cc   ROBOT ASSISTED  LAPAROSCOPIC RADICAL PROSTATECTOMY N/A 08/05/2014   Procedure: ROBOTIC ASSISTED LAPAROSCOPIC RADICAL PROSTATECTOMY LEVEL 2;  Surgeon: Heloise Purpura, MD;  Location: [facility];  Service: Urology;  Laterality: N/A;   TONSILLECTOMY      FAMHx:  Family History  Problem Relation Age of Onset   Cancer Mother        lung    SOCHx:   reports that he has never smoked. He has never used smokeless tobacco. He reports current alcohol use. He reports that he does not use drugs.  ALLERGIES:  Allergies  Allergen Reactions   Codeine Rash   Codeine Phosphate Rash    ROS: Pertinent items  noted in HPI and remainder of comprehensive ROS otherwise negative.  HOME MEDS: Current Outpatient Medications on File Prior to Visit  Medication Sig Dispense Refill   aspirin 81 MG chewable tablet 1 tablet     cholecalciferol (VITAMIN D) 25 MCG (1000 UNIT) tablet 1 capsule     Coenzyme Q10 (CO Q-10) 100 MG CAPS See admin instructions.     omeprazole (PRILOSEC) 20 MG capsule Take 20 mg by mouth daily as needed (heartburn).      REPATHA SURECLICK 140 MG/ML SOAJ INJECT 1 DOSE INTO THE SKIN EVERY 14 DAYS 6 mL 3   sildenafil (REVATIO) 20 MG tablet 1 tablet     valsartan-hydrochlorothiazide (DIOVAN-HCT) 160-12.5 MG per tablet Take 1 tablet by mouth every morning.      No current facility-administered medications on file prior to visit.    LABS/IMAGING: No results found for this or any previous visit (from the past 48 hour(s)). No results found.  LIPID PANEL: No results found for: "CHOL", "TRIG", "HDL", "CHOLHDL", "VLDL", "LDLCALC", "LDLDIRECT"  WEIGHTS: Wt Readings from Last 3 Encounters:  11/05/22 159 lb 3.2 oz (72.2 kg)  06/21/22 161 lb 6.4 oz (73.2 kg)  06/22/21 159 lb (72.1 kg)    VITALS: BP (!) 140/80 (BP Location: Left Arm, Patient Position: Sitting, Cuff Size: Normal)   Pulse 77   Ht 5\' 8"  (1.727 m)   Wt 159 lb 3.2 oz (72.2 kg)   SpO2 98%   BMI 24.21 kg/m   EXAM: Deferred  EKG: Deferred  ASSESSMENT: Abnormal CAC score of 342, 69th percentile for age and sex matched controls Mixed dyslipidemia, goal LDL less than 70 Hypertension-controlled Family history of premature ASCVD Negative LP(a)  PLAN: 1.   Dan Medina will need repeat lipids.  Will try to obtain them from his PCP but if they are not available go ahead and redraw them.  Plan follow-up in 6 months.  The headache was somewhat concerning because it was with activity although this has been described.  There is always a concern about the "thunderclap" headache however he ate did not have any neurologic  sequelae related to this.  If he has any recurrent headache then he should have cerebral imaging.  Plan follow-up with me in 6 months or sooner as necessary.  Chrystie Nose, MD, [facility], FACP  [place]  [facility]  Medical Director of the Advanced Lipid Disorders &  Cardiovascular Risk Reduction Clinic Diplomate of the American Board of Clinical Lipidology Attending Cardiologist  Direct Dial: [phone]  Fax: [phone]  Website:  [email or web address]  Lisette Abu Mckinleigh Schuchart 11/05/2022, 10:13 AM

## 2022-11-23 LAB — NMR, LIPOPROFILE: HDL Particle Number: 44.8 umol/L (ref >=30.5)

## 2023-01-12 ENCOUNTER — Telehealth: Payer: Self-pay | Admitting: Internal Medicine

## 2023-01-12 NOTE — Telephone Encounter (Signed)
Recommend patient apply for patient assistance using the [employer] Safety Net.    https://www.amgensafetynetfoundation.com/assets/pdf/AMGEN-SNF-Application-Prescription-Repatha-EN.pdf

## 2023-01-12 NOTE — Telephone Encounter (Signed)
Pt c/o medication issue:  1. Name of Medication: REPATHA SURECLICK 140 MG/ML SOAJ   2. How are you currently taking this medication (dosage and times per day)?   3. Are you having a reaction (difficulty breathing--STAT)?   4. What is your medication issue? Patient is requesting call back to discuss if there are other options for this medication or a discount availability. States the price is too high.

## 2023-01-12 NOTE — Telephone Encounter (Signed)
Call goes straight to VM.  LM for patient to call office for recommendations

## 2023-01-12 NOTE — Telephone Encounter (Signed)
Patient ask multiple insurance questions and advised to speak to his insurance for the answers.  I advised would send him the link via his MY Chart.  He states going out of the [place] and advised him to apply for assistance online and can be processing while he is gone

## 2023-01-12 NOTE — Telephone Encounter (Signed)
Please advise alternatives for patient

## 2023-06-22 ENCOUNTER — Telehealth: Payer: Self-pay | Admitting: Pharmacy Technician

## 2023-06-22 ENCOUNTER — Other Ambulatory Visit (HOSPITAL_COMMUNITY): Payer: Self-pay

## 2023-06-22 NOTE — Telephone Encounter (Signed)
 Pharmacy Patient Advocate Encounter  Received notification from [facility] that Prior Authorization for repatha has been APPROVED from 06/22/23 to 06/21/24   PA #/Case ID/Reference #: [id number]    Pt just got 04/11/23 84 days

## 2023-06-22 NOTE — Telephone Encounter (Signed)
 Pharmacy Patient Advocate Encounter   Received notification from Fax that prior authorization for REPATHA is required/requested.   Insurance verification completed.   The patient is insured through [facility] .   Per test claim: PA required; PA submitted to above mentioned insurance via CoverMyMeds Key/confirmation #/EOC [id number] Status is pending

## 2023-10-19 ENCOUNTER — Other Ambulatory Visit: Payer: Self-pay | Admitting: Internal Medicine

## 2023-10-19 DIAGNOSIS — E785 Hyperlipidemia, unspecified: Secondary | ICD-10-CM

## 2023-10-19 DIAGNOSIS — R931 Abnormal findings on diagnostic imaging of heart and coronary circulation: Secondary | ICD-10-CM

## 2023-11-17 DIAGNOSIS — R32 Unspecified urinary incontinence: Secondary | ICD-10-CM | POA: Diagnosis not present

## 2023-11-17 DIAGNOSIS — C61 Malignant neoplasm of prostate: Secondary | ICD-10-CM | POA: Diagnosis not present

## 2023-11-17 DIAGNOSIS — I10 Essential (primary) hypertension: Secondary | ICD-10-CM | POA: Diagnosis not present

## 2023-11-17 DIAGNOSIS — I2510 Atherosclerotic heart disease of native coronary artery without angina pectoris: Secondary | ICD-10-CM | POA: Diagnosis not present

## 2023-11-17 DIAGNOSIS — N5231 Erectile dysfunction following radical prostatectomy: Secondary | ICD-10-CM | POA: Diagnosis not present

## 2023-11-17 DIAGNOSIS — R7303 Prediabetes: Secondary | ICD-10-CM | POA: Diagnosis not present

## 2023-11-30 ENCOUNTER — Other Ambulatory Visit: Payer: Self-pay | Admitting: Internal Medicine

## 2023-11-30 DIAGNOSIS — E785 Hyperlipidemia, unspecified: Secondary | ICD-10-CM

## 2023-11-30 DIAGNOSIS — R931 Abnormal findings on diagnostic imaging of heart and coronary circulation: Secondary | ICD-10-CM

## 2023-12-26 ENCOUNTER — Encounter: Payer: Self-pay | Admitting: Pharmacist

## 2023-12-26 ENCOUNTER — Other Ambulatory Visit: Payer: Self-pay | Admitting: Internal Medicine

## 2023-12-26 DIAGNOSIS — R931 Abnormal findings on diagnostic imaging of heart and coronary circulation: Secondary | ICD-10-CM

## 2023-12-26 DIAGNOSIS — E785 Hyperlipidemia, unspecified: Secondary | ICD-10-CM

## 2023-12-28 DIAGNOSIS — B9689 Other specified bacterial agents as the cause of diseases classified elsewhere: Secondary | ICD-10-CM | POA: Diagnosis not present

## 2023-12-28 DIAGNOSIS — J208 Acute bronchitis due to other specified organisms: Secondary | ICD-10-CM | POA: Diagnosis not present

## 2024-01-02 ENCOUNTER — Telehealth: Payer: Self-pay | Admitting: Pharmacy Technician

## 2024-01-02 ENCOUNTER — Other Ambulatory Visit (HOSPITAL_COMMUNITY): Payer: Self-pay

## 2024-01-02 NOTE — Telephone Encounter (Signed)
 Pharmacy Patient Advocate Encounter   Received notification from CoverMyMeds that prior authorization for repatha  is required/requested.   Insurance verification completed.   The patient is insured through [employer] .   Per test claim: PA required; PA submitted to above mentioned insurance via Latent Key/confirmation #/EOC [id number] Status is pending

## 2024-01-02 NOTE — Telephone Encounter (Signed)
/  Pharmacy Patient Advocate Encounter  Received notification from [employer] that Prior Authorization for Repatha  has been APPROVED from 04/06/23 to 04/04/24. Ran test claim, Copay is $40.00- one month. This test claim was processed through [facility]- copay amounts may vary at other pharmacies due to pharmacy/plan contracts, or as the patient moves through the different stages of their insurance plan.   PA #/Case ID/Reference #: [id number]

## 2024-02-06 ENCOUNTER — Other Ambulatory Visit: Payer: Self-pay | Admitting: Internal Medicine

## 2024-02-06 DIAGNOSIS — R931 Abnormal findings on diagnostic imaging of heart and coronary circulation: Secondary | ICD-10-CM

## 2024-02-06 DIAGNOSIS — E785 Hyperlipidemia, unspecified: Secondary | ICD-10-CM

## 2024-02-15 ENCOUNTER — Ambulatory Visit: Payer: Self-pay | Attending: Internal Medicine | Admitting: Internal Medicine

## 2024-02-15 VITALS — BP 140/80 | HR 75 | Ht 68.0 in | Wt 154.0 lb

## 2024-02-15 DIAGNOSIS — I10 Essential (primary) hypertension: Secondary | ICD-10-CM

## 2024-02-15 DIAGNOSIS — E785 Hyperlipidemia, unspecified: Secondary | ICD-10-CM

## 2024-02-15 DIAGNOSIS — R931 Abnormal findings on diagnostic imaging of heart and coronary circulation: Secondary | ICD-10-CM | POA: Diagnosis not present

## 2024-02-15 NOTE — Progress Notes (Signed)
 LIPID CLINIC CONSULT NOTE  Chief Complaint:  Follow-up dyslipidemia  Primary Care Physician: Charlott Dorn LABOR, MD  Primary Cardiologist:  None  HPI:  Dan Medina is a 73 y.o. male who is being seen today for the evaluation of dyslipidemia at the request of No ref. provider found. 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  Recently underwent further restratification with a coronary calcium  score that was elevated at 342, 69 percentile for age and sex matched controls.  This showed multivessel coronary calcium  and left main coronary calcium .  He is active and walks regularly and denies any chest pain or worsening shortness of breath.  He had an LDL particle test recently which showed LDL-P of 1264, LDL-C of 117 and small LDL-C of 527.  This is in the setting of atorvastatin  40 mg which she had previously taken 3 times a week but recently increased to 4 times a week.  He had thought that he had had leg pain associated with this however symptoms have not really changed.  Despite this, he would remain well above target LDL less than 70 and needs more aggressive lipid management.  He notes that there is a strong family history of heart disease including grandfather who had coronary disease, father did live into his 67s, and 2 siblings who had TIAs.  06/21/2022  Mr. Dan Medina returns today for follow-up.  He has had further improvement in his lipids on Praluent .  His LDL particle number is 807, LDL-C is 83, triglycerides were 120 and HDL of 67.  His LP(a) was negative at 9.9 nmol/L.  In all his numbers are quite a bit better.  His particle numbers even are more reflective of improvement.  LDL-C is still above goal less than 70.  We discussed approaches including dietary modifications or potential for additional therapy.  11/05/2022  Mr. Dan Medina returns today for follow-up.  He continues on the Repatha .  He denies any  significant side effects.  He said he had recent lab work at his PCP although was not clear whether he had lipids drawn or not.  He also related that he had a recent episode of posterior headache that occurred with sexual activity.  This abated.  He has not had that previously.  He said he had taken his blood pressure medicines.  He did not use the sildenafil prior to this episode.  He has not had headaches like this before.  He denied any strokelike symptoms.  02/15/2024  Mr. Dan Medina returns today for follow-up.  Overall he seems to be doing well.  He has retired from agricultural consultant.  He is more tired and not exercise work on his health.  Recently blood pressure was little bit elevated.  His PCP has readjusted his meds.  His blood pressure was high today but he did not take his medicine this morning.  He has been compliant with the Repatha .  He does note some side effects with it for the first 24 to 48 hours where he feels somewhat fatigued but he says it is very tolerable.  He had it recently renewed but he needs repeat labs since his last lab work was in August 2024.  He did have a high coronary calcium  score but remains asymptomatic without chest pain or shortness of breath.  Have encouraged to stay on low-dose aspirin.  LP(a) is negative.  PMHx:  Past Medical History:  Diagnosis Date   Anxiety  Cervical radiculopathy, chronic    Depression    Esophageal reflux    Family history of adverse reaction to anesthesia    family members slow to wake up    History of kidney stones    Hypertension    Lumbar radiculopathy    Prostate cancer (HCC) 05/07/14   Gleason 7, volume 49.3 cc    Past Surgical History:  Procedure Laterality Date   COLONOSCOPY WITH PROPOFOL  N/A 09/20/2016   Procedure: COLONOSCOPY WITH PROPOFOL ;  Surgeon: Vicci Gladis POUR, MD;  Location: [facility];  Service: Endoscopy;  Laterality: N/A;   cystoscopy with ureteroscopy  2006   LITHOTRIPSY  2008   LYMPHADENECTOMY Bilateral  08/05/2014   Procedure: LYMPHADENECTOMY;  Surgeon: Gretel Ferrara, MD;  Location: [facility];  Service: Urology;  Laterality: Bilateral;   PROSTATE BIOPSY  05/07/14   Gleason 7, volume 49.3 cc   ROBOT ASSISTED LAPAROSCOPIC RADICAL PROSTATECTOMY N/A 08/05/2014   Procedure: ROBOTIC ASSISTED LAPAROSCOPIC RADICAL PROSTATECTOMY LEVEL 2;  Surgeon: Gretel Ferrara, MD;  Location: [facility];  Service: Urology;  Laterality: N/A;   TONSILLECTOMY      FAMHx:  Family History  Problem Relation Age of Onset   Cancer Mother        lung    SOCHx:   reports that he has never smoked. He has never used smokeless tobacco. He reports current alcohol use. He reports that he does not use drugs.  ALLERGIES:  Allergies  Allergen Reactions   Codeine Rash   Codeine Phosphate Rash    ROS: Pertinent items noted in HPI and remainder of comprehensive ROS otherwise negative.  HOME MEDS: Current Outpatient Medications on File Prior to Visit  Medication Sig Dispense Refill   aspirin 81 MG chewable tablet 1 tablet     cholecalciferol (VITAMIN D) 25 MCG (1000 UNIT) tablet 1 capsule     Coenzyme Q10 (CO Q-10) 100 MG CAPS See admin instructions.     omeprazole (PRILOSEC) 20 MG capsule Take 20 mg by mouth daily as needed (heartburn).      REPATHA  SURECLICK 140 MG/ML SOAJ ADMINISTER 1 ML UNDER THE SKIN EVERY 14 DAYS 2 mL 0   sildenafil (REVATIO) 20 MG tablet 1 tablet     valsartan-hydrochlorothiazide (DIOVAN-HCT) 320-12.5 MG tablet Take 1 tablet by mouth daily.     No current facility-administered medications on file prior to visit.    LABS/IMAGING: No results found for this or any previous visit (from the past 48 hours). No results found.  LIPID PANEL: No results found for: CHOL, TRIG, HDL, CHOLHDL, VLDL, LDLCALC, LDLDIRECT  WEIGHTS: Wt Readings from Last 3 Encounters:  02/15/24 154 lb (69.9 kg)  11/05/22 159 lb 3.2 oz (72.2 kg)  06/21/22 161 lb 6.4 oz (73.2 kg)    VITALS: BP (!) 140/80 Comment: has  not taken BP med  Pulse 75   Ht 5' 8 (1.727 m)   Wt 154 lb (69.9 kg)   SpO2 94%   BMI 23.42 kg/m   EXAM: General appearance: alert and no distress Lungs: clear to auscultation bilaterally Heart: regular rate and rhythm, S1, S2 normal, no murmur, click, rub or gallop Extremities: extremities normal, atraumatic, no cyanosis or edema Neurologic: Grossly normal  EKG: Deferred  ASSESSMENT: Abnormal CAC score of 342, 69th percentile for age and sex matched controls Mixed dyslipidemia, goal LDL less than 70 Hypertension Family history of premature ASCVD Negative LP(a)  PLAN: 1.   Mr. Bulman seems to be doing well.  Will continue on  the Repatha  as he has met his target cholesterol last year.  Will plan repeat lipids today since he is fasting.  Blood pressure was a little elevated however recently has been better controlled after an adjustment in his medications.  He did not take his blood pressure medicine today.  I encouraged regular exercise now that he is retired.  Will arrange for follow-up with our prevention APP in 1 year or sooner as necessary.  Vinie KYM Maxcy, MD, [facility], FNLA, FACP  [facility]  [facility]  Medical Director of the Advanced Lipid Disorders &  Cardiovascular Risk Reduction Clinic Diplomate of the American Board of Clinical Lipidology Attending Cardiologist  Direct Dial: [phone]  Fax: [phone]  Website:  [email or web address] [email or web address] Leandrew Keech 02/15/2024, 8:18 AM

## 2024-02-15 NOTE — Patient Instructions (Signed)
 Medication Instructions:  NO CHANGES  *If you need a refill on your cardiac medications before your next appointment, please call your pharmacy*  Lab Work: NMR lipoprofile -- 1st floor  If you have labs (blood work) drawn today and your tests are completely normal, you will receive your results only by: MyChart Message (if you have MyChart) OR A paper copy in the mail If you have any lab test that is abnormal or we need to change your treatment, we will call you to review the results.  Follow-Up: At [facility], you and your health needs are our priority.  As part of our continuing mission to provide you with exceptional heart care, our providers are all part of one team.  This team includes your primary Cardiologist (physician) and Advanced Practice Providers or APPs (Physician Assistants and Nurse Practitioners) who all work together to provide you with the care you need, when you need it.  Your next appointment:    12 months with Rosaline Bane, NP  We recommend signing up for the patient portal called MyChart.  Sign up information is provided on this After Visit Summary.  MyChart is used to connect with patients for Virtual Visits (Telemedicine).  Patients are able to view lab/test results, encounter notes, upcoming appointments, etc.  Non-urgent messages can be sent to your provider as well.   To learn more about what you can do with MyChart, go to [email or web address].

## 2024-02-16 DIAGNOSIS — N5201 Erectile dysfunction due to arterial insufficiency: Secondary | ICD-10-CM | POA: Diagnosis not present

## 2024-02-16 DIAGNOSIS — R399 Unspecified symptoms and signs involving the genitourinary system: Secondary | ICD-10-CM | POA: Diagnosis not present

## 2024-02-16 DIAGNOSIS — C61 Malignant neoplasm of prostate: Secondary | ICD-10-CM | POA: Diagnosis not present

## 2024-02-16 LAB — NMR, LIPOPROFILE
Cholesterol, Total: 163 mg/dL (ref 100–199)
HDL Particle Number: 43.4 umol/L (ref >=30.5)
HDL-C: 60 mg/dL (ref >39)
LDL Particle Number: 876 nmol/L (ref <1000)
LDL Size: 20.5 nm — ABNORMAL LOW (ref >20.5)
LDL-C (NIH Calc): 75 mg/dL (ref 0–99)
LP-IR Score: 72 — ABNORMAL HIGH (ref <=45)
Small LDL Particle Number: 347 nmol/L (ref <=527)
Triglycerides: 166 mg/dL — ABNORMAL HIGH (ref 0–149)

## 2024-02-20 ENCOUNTER — Ambulatory Visit: Payer: Self-pay | Admitting: Internal Medicine

## 2024-03-07 ENCOUNTER — Other Ambulatory Visit: Payer: Self-pay | Admitting: Internal Medicine

## 2024-03-07 DIAGNOSIS — R931 Abnormal findings on diagnostic imaging of heart and coronary circulation: Secondary | ICD-10-CM

## 2024-03-07 DIAGNOSIS — E785 Hyperlipidemia, unspecified: Secondary | ICD-10-CM

## 2024-05-09 DIAGNOSIS — E7800 Pure hypercholesterolemia, unspecified: Secondary | ICD-10-CM | POA: Insufficient documentation

## 2024-05-09 DIAGNOSIS — Z8249 Family history of ischemic heart disease and other diseases of the circulatory system: Secondary | ICD-10-CM | POA: Insufficient documentation

## 2024-05-09 DIAGNOSIS — E559 Vitamin D deficiency, unspecified: Secondary | ICD-10-CM | POA: Insufficient documentation

## 2024-05-09 DIAGNOSIS — G479 Sleep disorder, unspecified: Secondary | ICD-10-CM | POA: Insufficient documentation

## 2024-05-09 DIAGNOSIS — N5231 Erectile dysfunction following radical prostatectomy: Secondary | ICD-10-CM | POA: Insufficient documentation

## 2024-05-09 DIAGNOSIS — K219 Gastro-esophageal reflux disease without esophagitis: Secondary | ICD-10-CM | POA: Insufficient documentation

## 2024-05-09 DIAGNOSIS — K635 Polyp of colon: Secondary | ICD-10-CM | POA: Insufficient documentation

## 2024-05-09 DIAGNOSIS — M5416 Radiculopathy, lumbar region: Secondary | ICD-10-CM | POA: Insufficient documentation

## 2024-05-09 DIAGNOSIS — R42 Dizziness and giddiness: Secondary | ICD-10-CM | POA: Insufficient documentation

## 2024-05-09 DIAGNOSIS — Z8546 Personal history of malignant neoplasm of prostate: Secondary | ICD-10-CM | POA: Insufficient documentation

## 2024-05-09 DIAGNOSIS — M461 Sacroiliitis, not elsewhere classified: Secondary | ICD-10-CM | POA: Insufficient documentation

## 2024-05-09 DIAGNOSIS — R7303 Prediabetes: Secondary | ICD-10-CM | POA: Insufficient documentation

## 2024-05-09 DIAGNOSIS — H6991 Unspecified Eustachian tube disorder, right ear: Secondary | ICD-10-CM | POA: Insufficient documentation

## 2024-05-09 DIAGNOSIS — H6120 Impacted cerumen, unspecified ear: Secondary | ICD-10-CM | POA: Insufficient documentation

## 2024-05-09 DIAGNOSIS — R972 Elevated prostate specific antigen [PSA]: Secondary | ICD-10-CM | POA: Insufficient documentation

## 2024-05-09 DIAGNOSIS — L570 Actinic keratosis: Secondary | ICD-10-CM | POA: Insufficient documentation

## 2024-05-09 DIAGNOSIS — Z860100 Personal history of colon polyps, unspecified: Secondary | ICD-10-CM | POA: Insufficient documentation

## 2024-05-09 DIAGNOSIS — M542 Cervicalgia: Secondary | ICD-10-CM | POA: Insufficient documentation

## 2024-05-09 DIAGNOSIS — R739 Hyperglycemia, unspecified: Secondary | ICD-10-CM | POA: Insufficient documentation

## 2024-05-09 DIAGNOSIS — I10 Essential (primary) hypertension: Secondary | ICD-10-CM | POA: Insufficient documentation

## 2024-05-09 DIAGNOSIS — M2550 Pain in unspecified joint: Secondary | ICD-10-CM | POA: Insufficient documentation

## 2024-05-09 DIAGNOSIS — I2510 Atherosclerotic heart disease of native coronary artery without angina pectoris: Secondary | ICD-10-CM | POA: Insufficient documentation

## 2024-05-09 DIAGNOSIS — Z7901 Long term (current) use of anticoagulants: Secondary | ICD-10-CM | POA: Insufficient documentation

## 2024-05-09 NOTE — Progress Notes (Unsigned)
"       ° °  Dan Ileana Collet, PhD, LAT, ATC acting as a scribe for Artist Lloyd, MD.  Dan Medina is a 74 y.o. male who presents to [facility] today for bilat leg pain x ?*** Pt locates pain to ***  Radiates: Aggravates: Treatments tried:  Pertinent review of systems: ***  Relevant historical information: ***   Exam:  There were no vitals taken for this visit. General: Well Developed, well nourished, and in no acute distress.   MSK: ***    Lab and Radiology Results No results found for this or any previous visit (from the past 72 hours). No results found.     Assessment and Plan: 74 y.o. male with ***   PDMP not reviewed this encounter. No orders of the defined types were placed in this encounter.  No orders of the defined types were placed in this encounter.    Discussed warning signs or symptoms. Please see discharge instructions. Patient expresses understanding.   ***  "

## 2024-05-11 DIAGNOSIS — N529 Male erectile dysfunction, unspecified: Secondary | ICD-10-CM | POA: Insufficient documentation

## 2024-05-14 ENCOUNTER — Ambulatory Visit: Admitting: Family Medicine
